# Patient Record
Sex: Female | Born: 1990 | Race: White | Hispanic: No | Marital: Married
Health system: Southern US, Community
[De-identification: ages and names within clinical notes are randomized; demographics above are authoritative.]

## PROBLEM LIST (undated history)

## (undated) DIAGNOSIS — G43909 Migraine, unspecified, not intractable, without status migrainosus: Secondary | ICD-10-CM

## (undated) HISTORY — PX: NO PAST SURGERIES: SHX2092

---

## 2013-05-25 ENCOUNTER — Telehealth: Payer: Self-pay | Admitting: Family Medicine

## 2013-05-25 ENCOUNTER — Ambulatory Visit: Payer: Self-pay | Admitting: Family Medicine

## 2013-05-25 DIAGNOSIS — Z0289 Encounter for other administrative examinations: Secondary | ICD-10-CM

## 2013-05-25 NOTE — Telephone Encounter (Signed)
Patient Information:  Caller Name: Megan Stone  Phone: [phone]  Patient: Megan Stone, Megan Stone  Gender: Female  DOB: 18-May-1990  Age: 23 Years  PCP: Adline Mangoampbell, [facility](Family Practice)  Pregnant: No  Office Follow Up:  Does the office need to follow up with this patient?: No  Instructions For The Office: N/A   Symptoms  Reason For Call & Symptoms: Pt calling regarding . Pt [facility] for sinus [facility]. Pt now has different sore throat. Pt states the back of her tongue is sore. Hurts to swallow, even into throat.  Reviewed Health History In EMR: Yes  Reviewed Medications In EMR: Yes  Reviewed Allergies In EMR: Yes  Reviewed Surgeries / Procedures: Yes  Date of Onset of Symptoms: 05/22/2013  Treatments Tried: Pt taking Ibuprofen Q 4-6 hrs prn for pain-  Treatments Tried Worked: Yes OB / GYN:  LMP: 05/18/2013  Guideline(s) Used:  Sore Throat  Disposition Per Guideline:   See Today in Office  Reason For Disposition Reached:   Severe sore throat pain  Advice Given:  N/A  Patient Will Follow Care Advice:  YES  Appointment Scheduled:  05/25/2013 14:00:00 Appointment Scheduled Provider:  Terrilee Filessmith, zach

## 2013-05-25 NOTE — Telephone Encounter (Signed)
Patient did not show.

## 2014-02-25 ENCOUNTER — Emergency Department (HOSPITAL_COMMUNITY)
Admission: EM | Admit: 2014-02-25 | Discharge: 2014-02-25 | Disposition: A | Discharge status: Nursing Home (Includes ICF) | Payer: BC Managed Care – PPO | Source: Home / Self Care | Attending: Emergency Medicine | Admitting: Emergency Medicine

## 2014-02-25 ENCOUNTER — Encounter (HOSPITAL_COMMUNITY): Payer: Self-pay | Admitting: Emergency Medicine

## 2014-02-25 ENCOUNTER — Emergency Department (HOSPITAL_COMMUNITY)
Admission: EM | Admit: 2014-02-25 | Discharge: 2014-02-26 | Disposition: A | Discharge status: Home / Self Care | Payer: BC Managed Care – PPO | Attending: Emergency Medicine | Admitting: Emergency Medicine

## 2014-02-25 DIAGNOSIS — Z3202 Encounter for pregnancy test, result negative: Secondary | ICD-10-CM | POA: Diagnosis not present

## 2014-02-25 DIAGNOSIS — R110 Nausea: Secondary | ICD-10-CM | POA: Insufficient documentation

## 2014-02-25 DIAGNOSIS — M549 Dorsalgia, unspecified: Secondary | ICD-10-CM | POA: Insufficient documentation

## 2014-02-25 DIAGNOSIS — R1013 Epigastric pain: Secondary | ICD-10-CM

## 2014-02-25 LAB — CBC WITH DIFFERENTIAL/PLATELET
BASOS ABS: 0 10*3/uL (ref 0.0–0.1)
Basophils Relative: 0 % (ref 0–1)
EOS ABS: 0.3 10*3/uL (ref 0.0–0.7)
Eosinophils Relative: 4 % (ref 0–5)
HEMATOCRIT: 39.4 % (ref 36.0–46.0)
Hemoglobin: 13.7 g/dL (ref 12.0–15.0)
LYMPHS ABS: 1.7 10*3/uL (ref 0.7–4.0)
Lymphocytes Relative: 22 % (ref 12–46)
MCH: 29.7 pg (ref 26.0–34.0)
MCHC: 34.8 g/dL (ref 30.0–36.0)
MCV: 85.5 fL (ref 78.0–100.0)
Monocytes Absolute: 0.6 10*3/uL (ref 0.1–1.0)
Monocytes Relative: 8 % (ref 3–12)
Neutro Abs: 5.2 10*3/uL (ref 1.7–7.7)
Neutrophils Relative %: 66 % (ref 43–77)
PLATELETS: 275 10*3/uL (ref 150–400)
RBC: 4.61 MIL/uL (ref 3.87–5.11)
RDW: 12.1 % (ref 11.5–15.5)
WBC: 7.8 10*3/uL (ref 4.0–10.5)

## 2014-02-25 LAB — COMPREHENSIVE METABOLIC PANEL
ALT: 15 U/L (ref 0–35)
AST: 18 U/L (ref 0–37)
Albumin: 4.2 g/dL (ref 3.5–5.2)
Alkaline Phosphatase: 57 U/L (ref 39–117)
Anion gap: 12 (ref 5–15)
BUN: 9 mg/dL (ref 6–23)
CO2: 25 mEq/L (ref 19–32)
CREATININE: 0.77 mg/dL (ref 0.50–1.10)
Calcium: 9.4 mg/dL (ref 8.4–10.5)
Chloride: 102 mEq/L (ref 96–112)
GFR calc Af Amer: >90 mL/min (ref >90)
GFR calc non Af Amer: >90 mL/min (ref >90)
GLUCOSE: 83 mg/dL (ref 70–99)
Potassium: 3.7 mEq/L (ref 3.7–5.3)
SODIUM: 139 meq/L (ref 137–147)
TOTAL PROTEIN: 7.1 g/dL (ref 6.0–8.3)
Total Bilirubin: 0.4 mg/dL (ref 0.3–1.2)

## 2014-02-25 LAB — POCT URINALYSIS DIP (DEVICE)
Bilirubin Urine: NEGATIVE
Glucose, UA: NEGATIVE mg/dL
HGB URINE DIPSTICK: NEGATIVE
KETONES UR: NEGATIVE mg/dL
Leukocytes, UA: NEGATIVE
Nitrite: NEGATIVE
Protein, ur: NEGATIVE mg/dL
SPECIFIC GRAVITY, URINE: 1.015 (ref 1.005–1.030)
UROBILINOGEN UA: 0.2 mg/dL (ref 0.0–1.0)
pH: 7.5 (ref 5.0–8.0)

## 2014-02-25 LAB — LIPASE, BLOOD: Lipase: 25 U/L (ref 11–59)

## 2014-02-25 LAB — I-STAT TROPONIN, ED: TROPONIN I, POC: 0.02 ng/mL (ref 0.00–0.08)

## 2014-02-25 LAB — POCT PREGNANCY, URINE: Preg Test, Ur: NEGATIVE

## 2014-02-25 NOTE — ED Provider Notes (Signed)
Medical screening examination/treatment/procedure(s) were performed by non-physician practitioner and as supervising physician I was immediately available for consultation/collaboration.  Leslee Homeavid Gillie Crisci, M.D.  Reuben Likesavid C Arrion Burruel, MD 02/25/14 2121

## 2014-02-25 NOTE — ED Notes (Addendum)
C/o epigastric pain onset this AM with nausea but no V or D.  Has not been able to eat due to pain and nausea.  Felt clammy off and on.  No chills or fever. C/o feeling lightheaded.  She ate yogurt and walnuts for breakfast. Pain radiates to her back and is sharp.

## 2014-02-25 NOTE — ED Provider Notes (Signed)
CSN: [id number]     Arrival date & time 02/25/14  1729 History   None    Chief Complaint  Patient presents with  . Abdominal Pain   (Consider location/radiation/quality/duration/timing/severity/associated sxs/prior Treatment) HPI Comments: Pt woke up with pain in epigastric area that is constant. Associated with feeling "clammy" and nausea. Never had similar sx previously.   Patient is a 23 y.o. female presenting with abdominal pain. The history is provided by the patient. No language interpreter was used.  Abdominal Pain Pain location:  Epigastric Pain quality: sharp   Pain radiates to:  L flank (radiates only when lying down or with deep breaths) Pain severity:  Severe Onset quality: woke up with pain  Duration:  1 day Timing:  Constant Progression:  Unchanged Chronicity:  New Relieved by:  Nothing Worsened by:  Nothing tried Ineffective treatments: alka seltzer, eating. Associated symptoms: chills and nausea   Associated symptoms: no constipation, no diarrhea, no dysuria, no fever, no vaginal discharge and no vomiting     History reviewed. No pertinent past medical history. History reviewed. No pertinent past surgical history. History reviewed. No pertinent family history. History  Substance Use Topics  . Smoking status: Never Smoker   . Smokeless tobacco: Not on file  . Alcohol Use: Yes     Comment: occasional   OB History   Grav Para Term Preterm Abortions TAB SAB Ect Mult Living                 Review of Systems  Constitutional: Positive for chills. Negative for fever.  Gastrointestinal: Positive for nausea and abdominal pain. Negative for vomiting, diarrhea, constipation and blood in stool.  Genitourinary: Negative for dysuria and vaginal discharge.    Allergies  Review of patient's allergies indicates no known allergies.  Home Medications   Prior to Admission medications   Not on File   LMP 01/28/2014 Physical Exam  Constitutional: She appears  well-developed and well-nourished. No distress.  Cardiovascular: Normal rate and regular rhythm.   Pulmonary/Chest: Effort normal and breath sounds normal.  Abdominal: Normal appearance and bowel sounds are normal. She exhibits no distension. There is no hepatosplenomegaly. There is tenderness in the epigastric area. There is no rigidity, no rebound, no guarding, no CVA tenderness, no tenderness at McBurney's point and negative Murphy's sign.    ED Course  Procedures (including critical care time) Labs Review Labs Reviewed  POCT PREGNANCY, URINE  POCT URINALYSIS DIP (DEVICE)    Imaging Review No results found.   MDM   1. Epigastric abdominal pain    Dr. Lorenz CoasterKeller examined pt with me.   Concern for diverticulitis. Transfer to ER for further eval.      Cathlyn ParsonsAngela M Kimari Coudriet, NP 02/25/14 [phone]

## 2014-02-25 NOTE — ED Notes (Signed)
Pt. reports epigastric pain radiating to mid back onset this morning with nausea and diaphoresis , pain worse with deep inspiration , denies chest pain / respirations unlabored . No cough or congestion . Denies fever or chills.

## 2014-02-26 LAB — URINALYSIS, ROUTINE W REFLEX MICROSCOPIC
BILIRUBIN URINE: NEGATIVE
GLUCOSE, UA: NEGATIVE mg/dL
Hgb urine dipstick: NEGATIVE
Ketones, ur: NEGATIVE mg/dL
Leukocytes, UA: NEGATIVE
Nitrite: NEGATIVE
PROTEIN: NEGATIVE mg/dL
Specific Gravity, Urine: 1.022 (ref 1.005–1.030)
UROBILINOGEN UA: 1 mg/dL (ref 0.0–1.0)
pH: 6.5 (ref 5.0–8.0)

## 2014-02-26 LAB — PREGNANCY, URINE: Preg Test, Ur: NEGATIVE

## 2014-02-26 MED ORDER — ONDANSETRON 4 MG PO TBDP
4.0000 mg | ORAL_TABLET | Freq: Once | ORAL | Status: AC
  Administered 2014-02-26: 4 mg via ORAL
  Filled 2014-02-26: qty 1

## 2014-02-26 MED ORDER — FAMOTIDINE 20 MG PO TABS
20.0000 mg | ORAL_TABLET | Freq: Once | ORAL | Status: AC
  Administered 2014-02-26: 20 mg via ORAL
  Filled 2014-02-26: qty 1

## 2014-02-26 MED ORDER — GI COCKTAIL ~~LOC~~
30.0000 mL | Freq: Once | ORAL | Status: AC
  Administered 2014-02-26: 30 mL via ORAL
  Filled 2014-02-26: qty 30

## 2014-02-26 MED ORDER — OMEPRAZOLE 20 MG PO CPDR: 20.0000 mg | DELAYED_RELEASE_CAPSULE | Freq: Every day | ORAL | Status: DC

## 2014-02-26 MED ORDER — ONDANSETRON 4 MG PO TBDP: 4.0000 mg | ORAL_TABLET | Freq: Three times a day (TID) | ORAL | Status: AC | PRN

## 2014-02-26 MED ORDER — FAMOTIDINE 20 MG PO TABS: 20.0000 mg | ORAL_TABLET | Freq: Two times a day (BID) | ORAL | Status: DC

## 2014-02-26 NOTE — ED Notes (Signed)
EDP with pt for assessment

## 2014-02-26 NOTE — ED Provider Notes (Signed)
CSN: [id number]     Arrival date & time 02/25/14  1919 History   First MD Initiated Contact with Patient 02/25/14 2359     Chief Complaint  Patient presents with  . Abdominal Pain     (Consider location/radiation/quality/duration/timing/severity/associated sxs/prior Treatment) HPI 23 year old female presents with epigastric abdominal pain that started this morning. She states she woke up with this pain. Describes as an aching pain. Throughout the day it is remain constant and she's not eaten all day until she was in the waiting room she was becoming so hungry. After eating approximate 5-10 minutes later she started having worse pain. Initially was a 7 at 10 and now is higher than that. Pain seems to radiate to her back just left of midline. Worsens with inspiration. Has had nausea all day but no vomiting. Denies any blood in her stool or black stools. No diarrhea or constipation. Has not been on any NSAIDs recently. No prior history of ulcers. Pain initially seemed to worsen with leaning over and putting pressure on abdomen.  History reviewed. No pertinent past medical history. History reviewed. No pertinent past surgical history. No family history on file. History  Substance Use Topics  . Smoking status: Never Smoker   . Smokeless tobacco: Not on file  . Alcohol Use: Yes     Comment: occasional   OB History   Grav Para Term Preterm Abortions TAB SAB Ect Mult Living                 Review of Systems  Constitutional: Negative for fever.  Respiratory: Negative for shortness of breath.   Cardiovascular: Negative for chest pain.  Gastrointestinal: Positive for nausea and abdominal pain. Negative for vomiting, diarrhea, constipation and blood in stool.  Genitourinary: Negative for dysuria, hematuria and menstrual problem.  Musculoskeletal: Positive for back pain.  All other systems reviewed and are negative.     Allergies  Review of patient's allergies indicates no known  allergies.  Home Medications   Prior to Admission medications   Not on File   BP 134/85  Pulse 74  Temp(Src) 98 F (36.7 C) (Oral)  Resp 18  SpO2 99%  LMP 02/03/2014 Physical Exam  Nursing note and vitals reviewed. Constitutional: She is oriented to person, place, and time. She appears well-developed and well-nourished.  HENT:  Head: Normocephalic and atraumatic.  Right Ear: External ear normal.  Left Ear: External ear normal.  Nose: Nose normal.  Eyes: Right eye exhibits no discharge. Left eye exhibits no discharge.  Cardiovascular: Normal rate, regular rhythm and normal heart sounds.   Pulmonary/Chest: Effort normal and breath sounds normal.  Abdominal: Soft. There is tenderness (mild) in the epigastric area. There is no rigidity, no rebound, no guarding, no CVA tenderness, no tenderness at McBurney's point and negative Murphy's sign.  Neurological: She is alert and oriented to person, place, and time.  Skin: Skin is warm and dry.    ED Course  Procedures (including critical care time) Labs Review Labs Reviewed  CBC WITH DIFFERENTIAL  COMPREHENSIVE METABOLIC PANEL  LIPASE, BLOOD  PREGNANCY, URINE  URINALYSIS, ROUTINE W REFLEX MICROSCOPIC  I-STAT TROPOININ, ED    Imaging Review No results found.   EKG Interpretation   Date/Time:  Friday February 25 2014 19:24:58 EDT Ventricular Rate:  75 PR Interval:  134 QRS Duration: 86 QT Interval:  368 QTC Calculation: 410 R Axis:   76 Text Interpretation:  Normal sinus rhythm Nonspecific T wave abnormality  Abnormal ECG No old  tracing to compare Confirmed by Ivelis Norgard  MD, Latarshia Jersey  ([id number]) on 02/26/2014 12:01:21 AM      MDM   Final diagnoses:  Epigastric abdominal pain    Mild epigastric pain/tenderness, but no RUQ, RLQ or other focal tenderness. My primary concern is for ulcer/gastritis. Low suspicion for cholecystitis, pancreatitis, colitis, SBO or appendicitis. Labwork here is normal. U/A and urine preg at urgent  care were negative. Patient's symptoms improved with GI cocktail and protonic's. This point she feels much better and is ready for discharge. Will DC with PPI, H2 blockers, and follow-up with PCP. Discussed strict return precautions.    Audree CamelScott T Aiyanah Kalama, MD 02/26/14 [phone]

## 2014-02-26 NOTE — Discharge Instructions (Signed)
Abdominal Pain °Many things can cause abdominal pain. Usually, abdominal pain is not caused by a disease and will improve without treatment. It can often be observed and treated at home. Your health care provider will do a physical exam and possibly order blood tests and X-rays to help determine the seriousness of your pain. However, in many cases, more time must pass before a clear cause of the pain can be found. Before that point, your health care provider may not know if you need more testing or further treatment. °HOME CARE INSTRUCTIONS  °Monitor your abdominal pain for any changes. The following actions may help to alleviate any discomfort you are experiencing: °· Only take over-the-counter or prescription medicines as directed by your health care provider. °· Do not take laxatives unless directed to do so by your health care provider. °· Try a clear liquid diet (broth, tea, or water) as directed by your health care provider. Slowly move to a bland diet as tolerated. °SEEK MEDICAL CARE IF: °· You have unexplained abdominal pain. °· You have abdominal pain associated with nausea or diarrhea. °· You have pain when you urinate or have a bowel movement. °· You experience abdominal pain that wakes you in the night. °· You have abdominal pain that is worsened or improved by eating food. °· You have abdominal pain that is worsened with eating fatty foods. °· You have a fever. °SEEK IMMEDIATE MEDICAL CARE IF:  °· Your pain does not go away within 2 hours. °· You keep throwing up (vomiting). °· Your pain is felt only in portions of the abdomen, such as the right side or the left lower portion of the abdomen. °· You pass bloody or black tarry stools. °MAKE SURE YOU: °· Understand these instructions.   °· Will watch your condition.   °· Will get help right away if you are not doing well or get worse.   °Document Released: 01/23/2005 Document Revised: 04/20/2013 Document Reviewed: 12/23/2012 °Exit[employer]® Patient Information  ©2015 [employer]. This information is not intended to replace advice given to you by your health care provider. Make sure you discuss any questions you have with your health care provider. °Peptic Ulcer °A peptic ulcer is a sore in the lining of your esophagus (esophageal ulcer), stomach (gastric ulcer), or in the first part of your small intestine (duodenal ulcer). The ulcer causes erosion into the deeper tissue. °CAUSES  °Normally, the lining of the stomach and the small intestine protects itself from the acid that digests food. The protective lining can be damaged by: °· An infection caused by a bacterium called Helicobacter pylori (H. pylori). °· Regular use of nonsteroidal anti-inflammatory drugs (NSAIDs), such as ibuprofen or aspirin. °· Smoking tobacco. °Other risk factors include being older than 50, drinking alcohol excessively, and having a family history of ulcer disease.  °SYMPTOMS  °· Burning pain or gnawing in the area between the chest and the belly button. °· Heartburn. °· Nausea and vomiting. °· Bloating. °The pain can be worse on an empty stomach and at night. If the ulcer results in bleeding, it can cause: °· Black, tarry stools. °· Vomiting of bright red blood. °· Vomiting of coffee-ground-looking materials. °DIAGNOSIS  °A diagnosis is usually made based upon your history and an exam. Other tests and procedures may be performed to find the cause of the ulcer. Finding a cause will help determine the best treatment. Tests and procedures may include: °· Blood tests, stool tests, or breath tests to check for the bacterium H.   pylori. °· An upper gastrointestinal (GI) series of the esophagus, stomach, and small intestine. °· An endoscopy to examine the esophagus, stomach, and small intestine. °· A biopsy. °TREATMENT  °Treatment may include: °· Eliminating the cause of the ulcer, such as smoking, NSAIDs, or alcohol. °· Medicines to reduce the amount of acid in your digestive tract. °· Antibiotic  medicines if the ulcer is caused by the H. pylori bacterium. °· An upper endoscopy to treat a bleeding ulcer. °· Surgery if the bleeding is severe or if the ulcer created a hole somewhere in the digestive system. °HOME CARE INSTRUCTIONS  °· Avoid tobacco, alcohol, and caffeine. Smoking can increase the acid in the stomach, and continued smoking will impair the healing of ulcers. °· Avoid foods and drinks that seem to cause discomfort or aggravate your ulcer. °· Only take medicines as directed by your caregiver. Do not substitute over-the-counter medicines for prescription medicines without talking to your caregiver. °· Keep any follow-up appointments and tests as directed. °SEEK MEDICAL CARE IF:  °· Your do not improve within 7 days of starting treatment. °· You have ongoing indigestion or heartburn. °SEEK IMMEDIATE MEDICAL CARE IF:  °· You have sudden, sharp, or persistent abdominal pain. °· You have bloody or dark black, tarry stools. °· You vomit blood or vomit that looks like coffee grounds. °· You become light-headed, weak, or feel faint. °· You become sweaty or clammy. °MAKE SURE YOU:  °· Understand these instructions. °· Will watch your condition. °· Will get help right away if you are not doing well or get worse. °Document Released: 04/12/2000 Document Revised: 08/30/2013 Document Reviewed: 11/13/2011 °ExitCare® Patient Information ©2015 [employer]. This information is not intended to replace advice given to you by your health care provider. Make sure you discuss any questions you have with your health care provider. ° °

## 2019-05-05 ENCOUNTER — Ambulatory Visit: Payer: Commercial Managed Care - PPO | Attending: Internal Medicine

## 2019-05-05 DIAGNOSIS — Z20822 Contact with and (suspected) exposure to covid-19: Secondary | ICD-10-CM | POA: Insufficient documentation

## 2019-05-07 ENCOUNTER — Telehealth: Payer: Self-pay | Admitting: General Practice

## 2019-05-07 LAB — NOVEL CORONAVIRUS, NAA: SARS-CoV-2, NAA: NOT DETECTED

## 2019-05-07 NOTE — Telephone Encounter (Signed)
Negative COVID results given. Patient results "NOT Detected." Caller expressed understanding. ° °

## 2019-10-28 ENCOUNTER — Other Ambulatory Visit: Payer: Self-pay

## 2019-10-28 ENCOUNTER — Emergency Department
Admission: EM | Admit: 2019-10-28 | Discharge: 2019-10-28 | Disposition: A | Discharge status: Home / Self Care | Payer: Commercial Managed Care - PPO | Attending: Emergency Medicine | Admitting: Emergency Medicine

## 2019-10-28 ENCOUNTER — Encounter: Payer: Self-pay | Admitting: Emergency Medicine

## 2019-10-28 ENCOUNTER — Emergency Department: Payer: Commercial Managed Care - PPO

## 2019-10-28 DIAGNOSIS — G43909 Migraine, unspecified, not intractable, without status migrainosus: Secondary | ICD-10-CM

## 2019-10-28 HISTORY — DX: Migraine, unspecified, not intractable, without status migrainosus: G43.909

## 2019-10-28 MED ORDER — METOCLOPRAMIDE HCL 5 MG/ML IJ SOLN
10.0000 mg | Freq: Once | INTRAMUSCULAR | Status: AC
  Administered 2019-10-28: 10 mg via INTRAVENOUS
  Filled 2019-10-28: qty 2

## 2019-10-28 MED ORDER — DIPHENHYDRAMINE HCL 50 MG/ML IJ SOLN
50.0000 mg | Freq: Once | INTRAMUSCULAR | Status: AC
  Administered 2019-10-28: 50 mg via INTRAVENOUS
  Filled 2019-10-28: qty 1

## 2019-10-28 MED ORDER — SODIUM CHLORIDE 0.9 % IV BOLUS
1000.0000 mL | Freq: Once | INTRAVENOUS | Status: AC
  Administered 2019-10-28: 1000 mL via INTRAVENOUS

## 2019-10-28 MED ORDER — KETOROLAC TROMETHAMINE 30 MG/ML IJ SOLN
30.0000 mg | Freq: Once | INTRAMUSCULAR | Status: AC
  Administered 2019-10-28: 30 mg via INTRAVENOUS
  Filled 2019-10-28: qty 1

## 2019-10-28 NOTE — ED Provider Notes (Signed)
[facility] Emergency Department Provider Note  Time seen: 1:07 PM  I have reviewed the triage vital signs and the nursing notes.   HISTORY  Chief Complaint Migraine   HPI Megan Stone is a 29 y.o. female with a past medical history of migraines presents to the emergency department with a migraine headache.  According to the patient she has pain in the back of her head that radiates around to the right side of her head.  States this is fairly typical for migraines also states photo and phonophobia.  States nausea is up with several episodes of vomiting which is also typical.  Patient took her home migraine medications without effect so she came to the emergency department for evaluation.  Patient states she has had increasing migraines since the birth of her child 2 years ago.  Patient states this is worse when she has had in the last several years.  Has been ongoing for the past 2 days without relief.  Patient states she has had to come to the emergency department previously for headaches but has been several years.  No numbness or weakness of any arm or leg confusion or slurred speech.  No fever.   Past Medical History:  Diagnosis Date  . Migraines     There are no problems to display for this patient.   History reviewed. No pertinent surgical history.  Prior to Admission medications   Medication Sig Start Date End Date Taking? Authorizing Provider  famotidine (PEPCID) 20 MG tablet Take 1 tablet (20 mg total) by mouth 2 (two) times daily. 02/26/14   Pricilla Loveless, MD  omeprazole (PRILOSEC) 20 MG capsule Take 1 capsule (20 mg total) by mouth daily. 02/26/14   Pricilla Loveless, MD  ondansetron (ZOFRAN ODT) 4 MG disintegrating tablet Take 1 tablet (4 mg total) by mouth every 8 (eight) hours as needed for nausea or vomiting. 02/26/14   Pricilla Loveless, MD    No Known Allergies  No family history on file.  Social History Social History   Tobacco Use  .  Smoking status: Never Smoker  Substance Use Topics  . Alcohol use: Yes    Comment: occasional  . Drug use: No    Review of Systems Constitutional: Negative for fever. Cardiovascular: Negative for chest pain. Respiratory: Negative for shortness of breath. Gastrointestinal: Negative for abdominal pain Musculoskeletal: Negative for musculoskeletal complaints Neurological: Moderate dull headache in the back of her head radiating to the right side. All other ROS negative  ____________________________________________   PHYSICAL EXAM:  VITAL SIGNS: ED Triage Vitals  Enc Vitals Group     BP 10/28/19 1119 (!) 137/99     Pulse Rate 10/28/19 1119 77     Resp 10/28/19 1119 16     Temp 10/28/19 1119 98.4 F (36.9 C)     Temp Source 10/28/19 1119 Oral     SpO2 10/28/19 1119 99 %     Weight 10/28/19 1114 257 lb (116.6 kg)     Height 10/28/19 1114 5\' 6"  (1.676 m)     Head Circumference --      Peak Flow --      Pain Score 10/28/19 1114 7     Pain Loc --      Pain Edu? --      Excl. in GC? --    Constitutional: Alert and oriented. Well appearing and in no distress. Eyes: Positive for photophobia. ENT      Head: Normocephalic and atraumatic.  Mouth/Throat: Mucous membranes are moist. Cardiovascular: Normal rate, regular rhythm Respiratory: Normal respiratory effort without tachypnea nor retractions. Breath sounds are clear Gastrointestinal: Soft and nontender. No distention. Musculoskeletal: Nontender with normal range of motion in all extremities.  Neurologic:  Normal speech and language. No gross focal neurologic deficits.  Equal grip strength bilaterally.  No pronator drift.  Cranial nerves intact. Skin:  Skin is warm, dry and intact.  Psychiatric: Mood and affect are normal.  ____________________________________________   RADIOLOGY  CT is negative  ____________________________________________   INITIAL IMPRESSION / ASSESSMENT AND PLAN / ED COURSE  Pertinent labs  & imaging results that were available during my care of the patient were reviewed by me and considered in my medical decision making (see chart for details).   Patient presents to the emergency department for migraine.  Patient states feels like her typical migraines which have been increasing in intensity and frequency since her child was delivered 2 years ago.  Patient states she sees her PCP took her typical migraine medications without relief.  States they are not talk about being referred to a headache specialist.  Patient denies any history of brain imaging in the past CT or MRI.  We will proceed with CT imaging as a precaution.  We will dose Toradol Reglan Benadryl and IV fluids and reassess.  Patient agreeable to plan.  CT scan is negative.  Patient states the headache is much improved.  We will discharge home.  Provided my typical migraine return precautions.  [name] was evaluated in Emergency Department on 10/28/2019 for the symptoms described in the history of present illness. She was evaluated in the context of the global COVID-19 pandemic, which necessitated consideration that the patient might be at risk for infection with the SARS-CoV-2 virus that causes COVID-19. Institutional protocols and algorithms that pertain to the evaluation of patients at risk for COVID-19 are in a state of rapid change based on information released by regulatory bodies including the CDC and federal and state organizations. These policies and algorithms were followed during the patient's care in the ED.  ____________________________________________   FINAL CLINICAL IMPRESSION(S) / ED DIAGNOSES  Migraine headache   Minna Antis, MD 10/28/19 1449

## 2019-10-28 NOTE — ED Triage Notes (Signed)
Pt reports migraine for the last 48 hours. Pt states hx of the same since she was a kid. Pt reports take maxol for it but no relief. States migraine is the same as always.

## 2021-05-03 IMAGING — CT CT HEAD W/O CM
3 series · 16 of 47 positions shown, 19 images · non-contrast
Comparison: None.

CLINICAL DATA: Headache for 48 hours.  No known injury.

EXAM:
CT HEAD WITHOUT CONTRAST
TECHNIQUE: Contiguous axial images were obtained from the base of the skull
through the vertex without intravenous contrast.

[Series 2: head wo · axial · 0.47mm/px · z∈[-131,-6]mm · 10 of 30 slices shown, 13 images]
[im 3/30  brain]
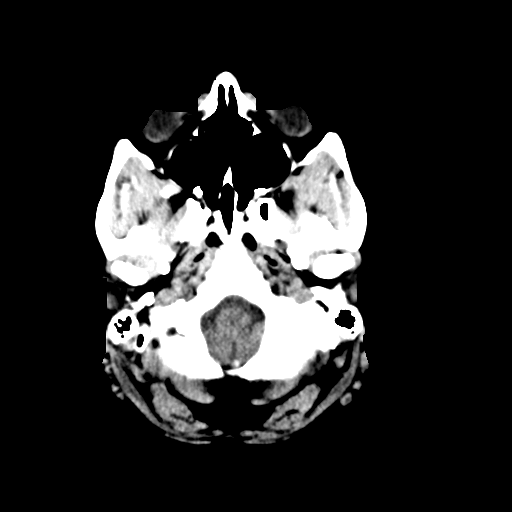
[im 3/30  bone]
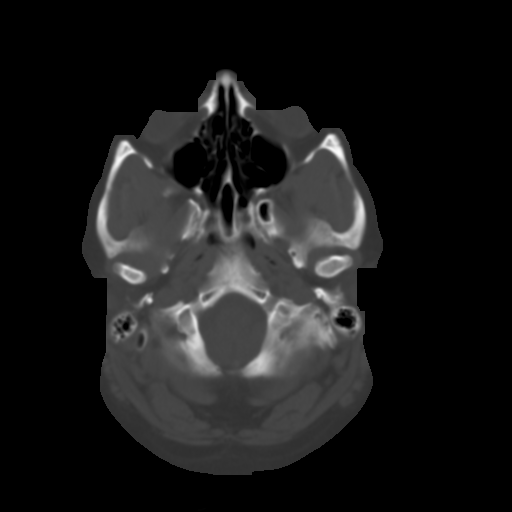
[im 6/30  brain]
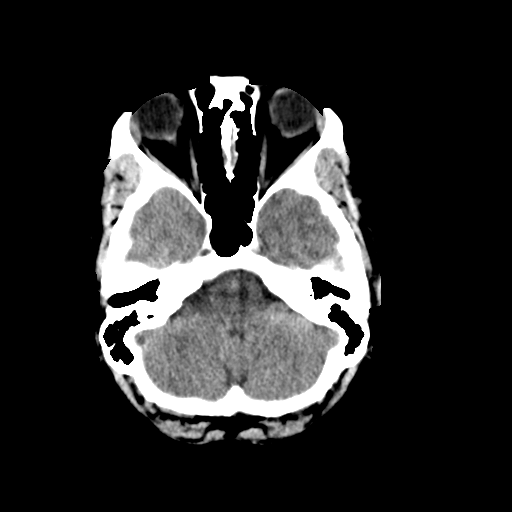
[im 9/30  brain]
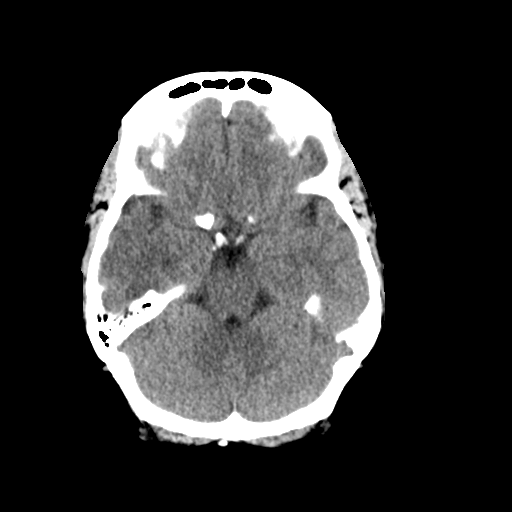
[im 11/30  brain]
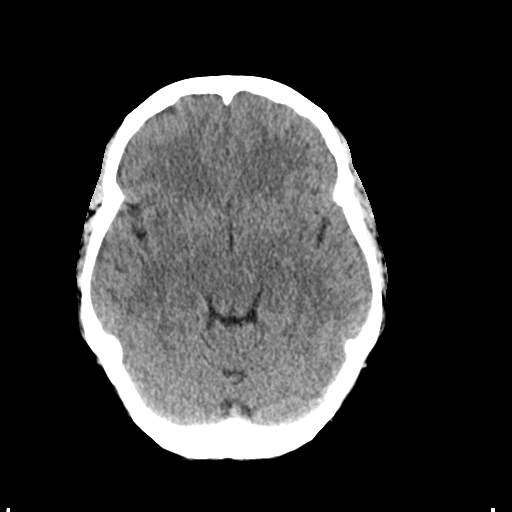
[im 14/30  brain]
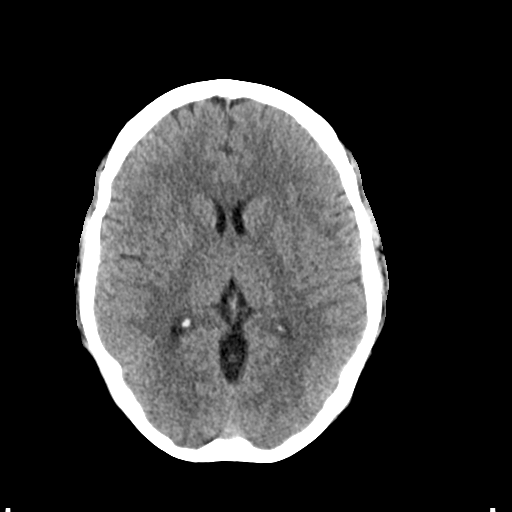
[im 14/30  bone]
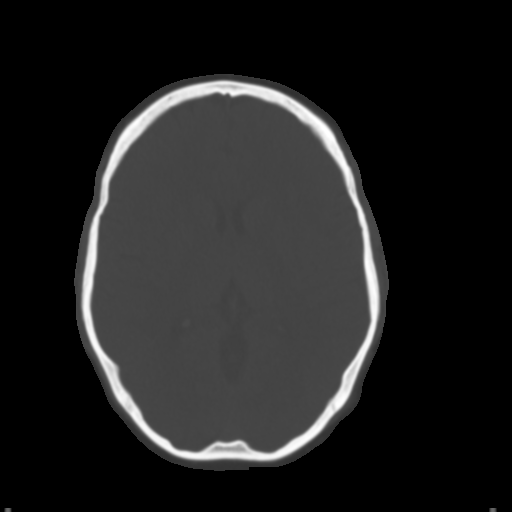
[im 17/30  brain]
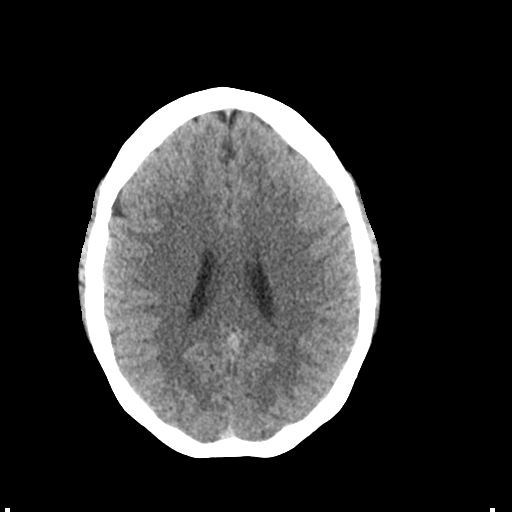
[im 20/30  brain]
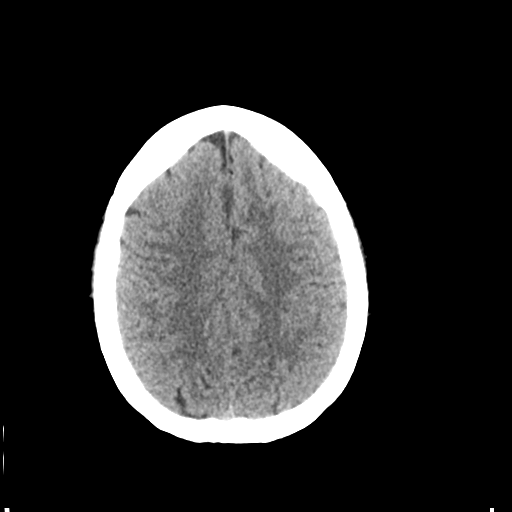
[im 23/30  brain]
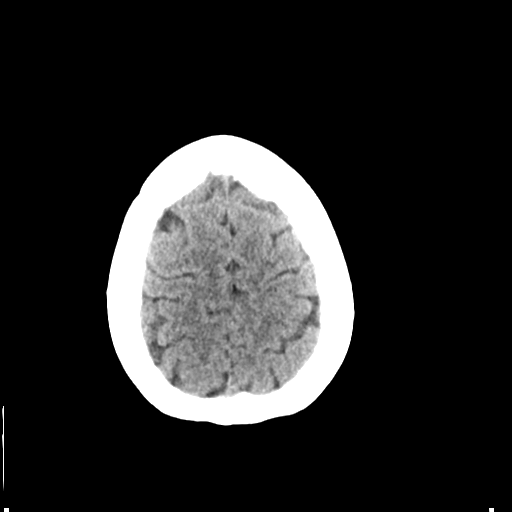
[im 25/30  brain]
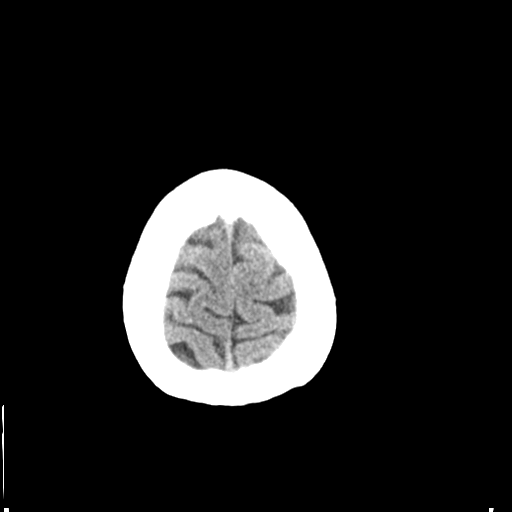
[im 25/30  bone]
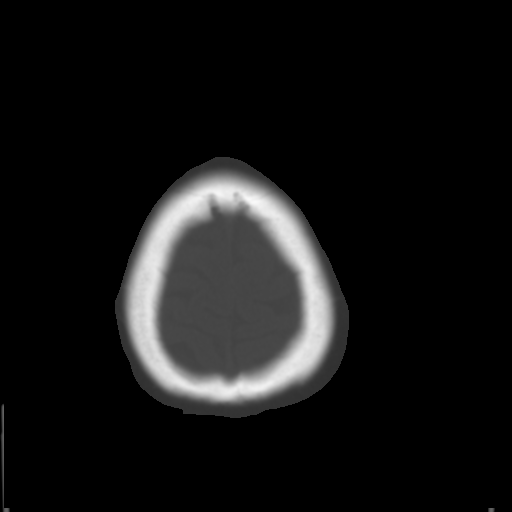
[im 28/30  brain]
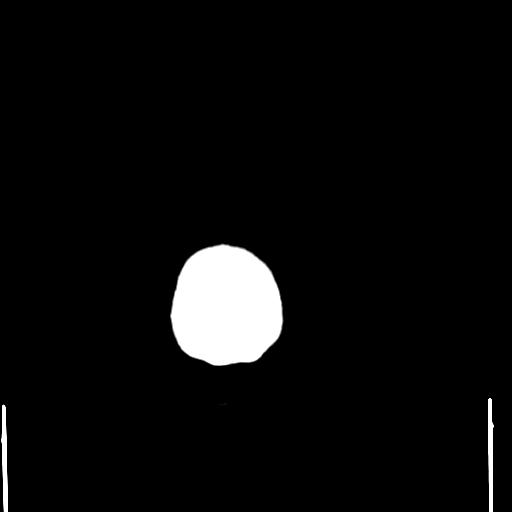

[Series 4: coronal soft tissue · coronal · 0.31mm/px · 3 of 68 slices shown]
[im 23/68  brain]
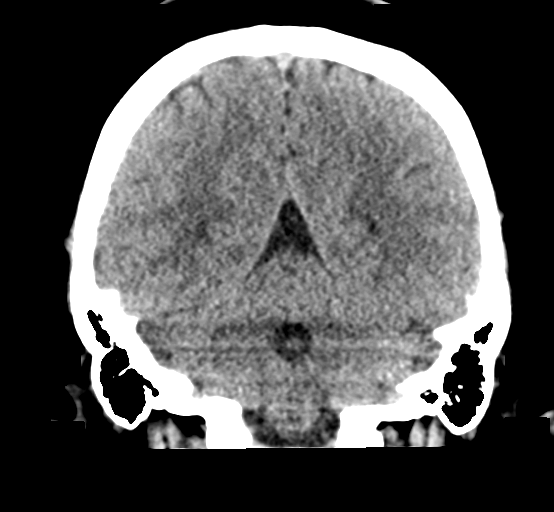
[im 30/68  brain]
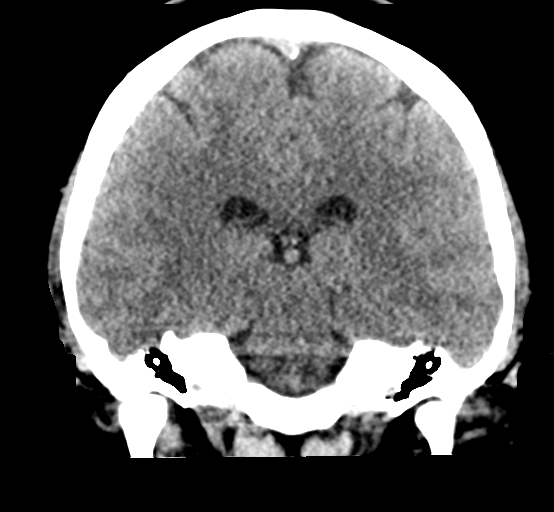
[im 38/68  brain]
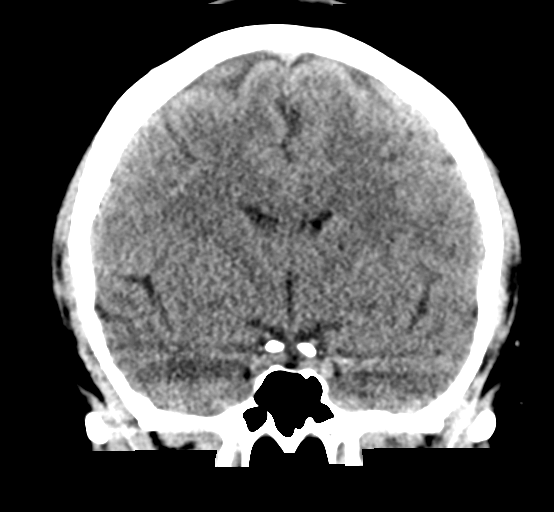

[Series 5: sagittal soft tissue · sagittal · 0.31mm/px · 3 of 58 slices shown]
[im 20/58  brain]
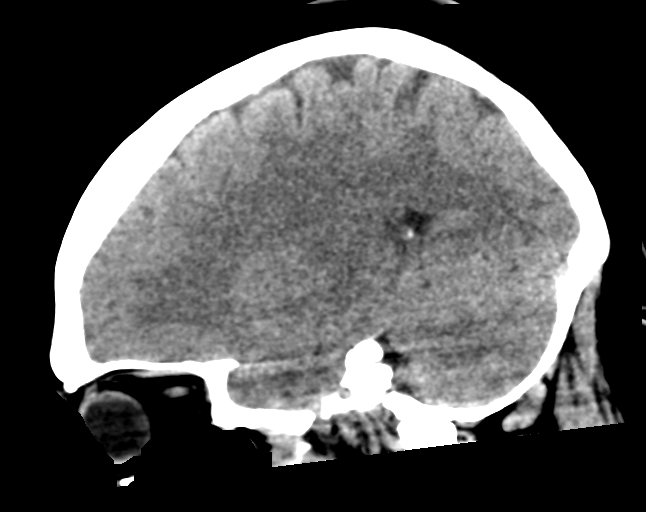
[im 29/58  brain]
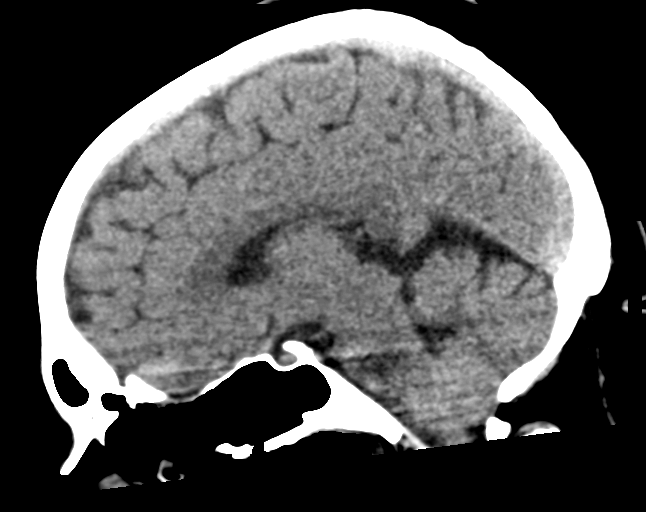
[im 39/58  brain]
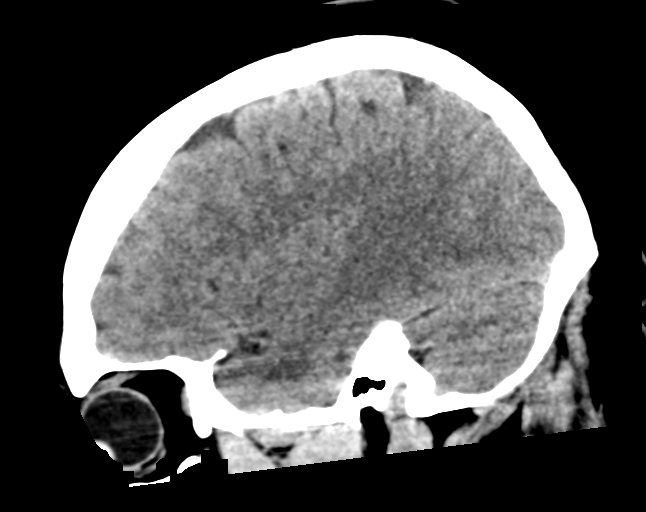

[16 of 47 positions shown; findings below may reference images not displayed]

FINDINGS: Brain: No evidence of acute infarction, hemorrhage, hydrocephalus,
extra-axial collection or mass lesion/mass effect.

Vascular: No hyperdense vessel or unexpected calcification.

Skull: Normal. Negative for fracture or focal lesion.

Sinuses/Orbits: Negative.

Other: None.
IMPRESSION: Normal head CT.

## 2022-12-10 ENCOUNTER — Other Ambulatory Visit (HOSPITAL_COMMUNITY): Payer: Self-pay

## 2022-12-10 MED ORDER — ZEPBOUND 15 MG/0.5ML ~~LOC~~ SOAJ
15.0000 mg | SUBCUTANEOUS | 0 refills | Status: DC
  Filled 2022-12-10 (×2): qty 2, 28d supply, fill #0

## 2022-12-13 ENCOUNTER — Other Ambulatory Visit (HOSPITAL_COMMUNITY): Payer: Self-pay

## 2023-02-04 ENCOUNTER — Other Ambulatory Visit (HOSPITAL_COMMUNITY): Payer: Self-pay

## 2023-02-04 MED ORDER — ZEPBOUND 15 MG/0.5ML ~~LOC~~ SOAJ
15.0000 mg | SUBCUTANEOUS | 0 refills | Status: DC
  Filled 2023-02-04: qty 2, 28d supply, fill #0

## 2023-02-12 ENCOUNTER — Other Ambulatory Visit (HOSPITAL_COMMUNITY): Payer: Self-pay

## 2023-02-12 ENCOUNTER — Other Ambulatory Visit: Payer: Self-pay

## 2023-05-02 ENCOUNTER — Other Ambulatory Visit (HOSPITAL_COMMUNITY): Payer: Self-pay

## 2023-05-02 MED ORDER — ZEPBOUND 15 MG/0.5ML ~~LOC~~ SOAJ
15.0000 mg | SUBCUTANEOUS | 0 refills | Status: DC
  Filled 2023-05-02: qty 2, 28d supply, fill #0

## 2023-05-09 ENCOUNTER — Other Ambulatory Visit (HOSPITAL_COMMUNITY): Payer: Self-pay

## 2023-07-22 ENCOUNTER — Other Ambulatory Visit (HOSPITAL_COMMUNITY): Payer: Self-pay

## 2023-07-22 MED ORDER — ZEPBOUND 15 MG/0.5ML ~~LOC~~ SOAJ
15.0000 mg | SUBCUTANEOUS | 0 refills | Status: DC
  Filled 2023-07-22: qty 2, 28d supply, fill #0

## 2023-07-25 ENCOUNTER — Other Ambulatory Visit (HOSPITAL_COMMUNITY): Payer: Self-pay

## 2023-09-04 ENCOUNTER — Other Ambulatory Visit (HOSPITAL_COMMUNITY): Payer: Self-pay

## 2023-09-04 MED ORDER — ZEPBOUND 15 MG/0.5ML ~~LOC~~ SOAJ
15.0000 mg | SUBCUTANEOUS | 0 refills | Status: DC
  Filled 2023-09-04: qty 2, 28d supply, fill #0

## 2023-09-05 ENCOUNTER — Other Ambulatory Visit (HOSPITAL_COMMUNITY): Payer: Self-pay

## 2023-10-03 ENCOUNTER — Other Ambulatory Visit (HOSPITAL_COMMUNITY): Payer: Self-pay

## 2023-10-03 ENCOUNTER — Encounter: Payer: Self-pay | Admitting: Neurology

## 2023-10-03 ENCOUNTER — Telehealth: Payer: Self-pay

## 2023-10-03 ENCOUNTER — Ambulatory Visit (INDEPENDENT_AMBULATORY_CARE_PROVIDER_SITE_OTHER): Admitting: Neurology

## 2023-10-03 VITALS — BP 125/89 | HR 101 | Ht 66.0 in | Wt 200.4 lb

## 2023-10-03 DIAGNOSIS — H539 Unspecified visual disturbance: Secondary | ICD-10-CM

## 2023-10-03 DIAGNOSIS — G43E09 Chronic migraine with aura, not intractable, without status migrainosus: Secondary | ICD-10-CM | POA: Diagnosis not present

## 2023-10-03 DIAGNOSIS — R519 Headache, unspecified: Secondary | ICD-10-CM

## 2023-10-03 DIAGNOSIS — R442 Other hallucinations: Secondary | ICD-10-CM | POA: Diagnosis not present

## 2023-10-03 DIAGNOSIS — R51 Headache with orthostatic component, not elsewhere classified: Secondary | ICD-10-CM

## 2023-10-03 DIAGNOSIS — G43709 Chronic migraine without aura, not intractable, without status migrainosus: Secondary | ICD-10-CM | POA: Diagnosis not present

## 2023-10-03 MED ORDER — EMGALITY 120 MG/ML ~~LOC~~ SOAJ: 240.0000 mg | Freq: Once | SUBCUTANEOUS | 0 refills | Status: DC

## 2023-10-03 MED ORDER — ZOLMITRIPTAN 5 MG PO TABS: 5.0000 mg | ORAL_TABLET | ORAL | 11 refills | Status: DC | PRN

## 2023-10-03 MED ORDER — NURTEC 75 MG PO TBDP: 75.0000 mg | ORAL_TABLET | Freq: Every day | ORAL | Status: DC | PRN

## 2023-10-03 MED ORDER — ALPRAZOLAM 0.25 MG PO TABS: ORAL_TABLET | ORAL | 0 refills | Status: DC

## 2023-10-03 NOTE — Patient Instructions (Addendum)
 Prevention: Emgality (Other shot is Ajovy) Acute: Zomig Catch early. If you know you will have the migraine during a time period like menses you can pretreat.  Nurtec : daily as needed for migraine MRI of the brain - [place] , xanax   Discussed:  There is increased risk for stroke in women with migraine with aura and a contraindication for the combined contraceptive pill for use by women who have migraine with aura. The risk for women with migraine without aura is lower. However other risk factors like smoking are far more likely to increase stroke risk than migraine. There is a recommendation for no smoking and for the use of OCPs without estrogen such as progestogen only pills particularly for women with migraine with aura.Megan Stone People who have migraine headaches with auras may be 3 times more likely to have a stroke caused by a blood clot, compared to migraine patients who don't see auras. Women who take hormone-replacement therapy may be 30 percent more likely to suffer a clot-based stroke than women not taking medication containing estrogen. Other risk factors like smoking and high blood pressure may be  much more important. And stroke is still a rare complication due to migraine aura and is controversial and lower doses may not cause a risk.  Rimegepant Disintegrating Tablets What is this medication? RIMEGEPANT (ri ME je pant) prevents and treats migraines. It works by blocking a substance in the body that causes migraines. This medicine may be used for other purposes; ask your health care provider or pharmacist if you have questions. COMMON BRAND NAME(S): NURTEC ODT What should I tell my care team before I take this medication? They need to know if you have any of these conditions: Kidney disease Liver disease An unusual or allergic reaction to rimegepant, other medications, foods, dyes, or preservatives Pregnant or trying to get pregnant Breast-feeding How should I use this  medication? Take this medication by mouth. Take it as directed on the prescription label. Leave the tablet in the sealed pack until you are ready to take it. With dry hands, open the pack and gently remove the tablet. If the tablet breaks or crumbles, throw it away. Use a new tablet. Place the tablet in the mouth and allow it to dissolve. Then, swallow it. Do not cut, crush, or chew this medication. You do not need water to take this medication. Talk to your care team about the use of this medication in children. Special care may be needed. Overdosage: If you think you have taken too much of this medicine contact a poison control center or emergency room at once. NOTE: This medicine is only for you. Do not share this medicine with others. What if I miss a dose? This does not apply. This medication is not for regular use. What may interact with this medication? Certain medications for fungal infections, such as fluconazole, itraconazole Rifampin This list may not describe all possible interactions. Give your health care provider a list of all the medicines, herbs, non-prescription drugs, or dietary supplements you use. Also tell them if you smoke, drink alcohol, or use illegal drugs. Some items may interact with your medicine. What should I watch for while using this medication? Visit your care team for regular checks on your progress. Tell your care team if your symptoms do not start to get better or if they get worse. What side effects may I notice from receiving this medication? Side effects that you should report to your care team as soon  as possible: Allergic reactions--skin rash, itching, hives, swelling of the face, lips, tongue, or throat Side effects that usually do not require medical attention (report to your care team if they continue or are bothersome): Nausea Stomach pain This list may not describe all possible side effects. Call your doctor for medical advice about side effects. You  may report side effects to FDA at 1-800-FDA-1088. Where should I keep my medication? Keep out of the reach of children and pets. Store at room temperature between 20 and 25 degrees C (68 and 77 degrees F). Get rid of any unused medication after the expiration date. To get rid of medications that are no longer needed or have expired: Take the medication to a medication take-back program. Check with your pharmacy or law enforcement to find a location. If you cannot return the medication, check the label or package insert to see if the medication should be thrown out in the garbage or flushed down the toilet. If you are not sure, ask your care team. If it is safe to put it in the trash, take the medication out of the container. Mix the medication with cat litter, dirt, coffee grounds, or other unwanted substance. Seal the mixture in a bag or container. Put it in the trash. NOTE: This sheet is a summary. It may not cover all possible information. If you have questions about this medicine, talk to your doctor, pharmacist, or health care provider.  2024 Elsevier/Gold Standard (2021-06-06 00:00:00) Zolmitriptan Tablets What is this medication? ZOLMITRIPTAN (zohl mi TRIP tan) treats migraines. It works by blocking pain signals and narrowing blood vessels in the brain. It belongs to a group of medications called triptans. It is not used to prevent migraines. This medicine may be used for other purposes; ask your health care provider or pharmacist if you have questions. COMMON BRAND NAME(S): Zomig What should I tell my care team before I take this medication? They need to know if you have any of these conditions: Circulation problems in fingers and toes Diabetes (high blood sugar) Heart disease High blood pressure High cholesterol History of irregular heartbeat History of stroke Kidney disease Liver disease Stomach or intestine problems Tobacco use An unusual or allergic reaction to zolmitriptan,  other medications, foods, dyes, or preservatives Pregnant or trying to get pregnant Breast-feeding How should I use this medication? Take this medication by mouth with a glass of water. Follow the directions on the prescription label. Do not take it more often than directed. Talk to your care team about the use of this medication in children. Special care may be needed. Overdosage: If you think you have taken too much of this medicine contact a poison control center or emergency room at once. NOTE: This medicine is only for you. Do not share this medicine with others. What if I miss a dose? This does not apply. This medication is not for regular use. What may interact with this medication? Do not take this medication with any of the following: Certain medications for migraine headache, such as almotriptan, eletriptan, frovatriptan, naratriptan, rizatriptan, sumatriptan, zolmitriptan Ergot alkaloids, such as dihydroergotamine, ergonovine, ergotamine, methylergonovine MAOIs, such as Carbex, Eldepryl, Marplan, Nardil, and Parnate This medication may also interact with the following: Certain medications for mental health conditions Cimetidine This list may not describe all possible interactions. Give your health care provider a list of all the medicines, herbs, non-prescription drugs, or dietary supplements you use. Also tell them if you smoke, drink alcohol, or use illegal drugs.  Some items may interact with your medicine. What should I watch for while using this medication? Visit your care team for regular checks on your progress. Tell your care team if your symptoms do not start to get better or if they get worse. This medication may affect your coordination, reaction time, or judgment. Do not drive or operate machinery until you know how this medication affects you. Sit up or stand slowly to reduce the risk of dizzy or fainting spells. Drinking alcohol with this medication can increase the risk  of these side effects. Your mouth may get dry. Chewing sugarless gum or sucking hard candy and drinking plenty of water may help. Contact your care team if the problem does not go away or is severe. Tell your care team right away if you have any change in your eyesight. If you take migraine medications for 10 or more days a month, your migraines may get worse. Keep a diary of headache days and medication use. Contact your care team if your migraine attacks occur more frequently. What side effects may I notice from receiving this medication? Side effects that you should report to your care team as soon as possible: Allergic reactions--skin rash, itching, hives, swelling of the face, lips, tongue, or throat Burning, pain, tingling, or color changes in the legs or feet Heart attack--pain or tightness in the chest, shoulders, arms, or jaw, nausea, shortness of breath, cold or clammy skin, feeling faint or lightheaded Heart rhythm changes--fast or irregular heartbeat, dizziness, feeling faint or lightheaded, chest pain, trouble breathing Increase in blood pressure Irritability, confusion, fast or irregular heartbeat, muscle stiffness, twitching muscles, sweating, high fever, seizure, chills, vomiting, diarrhea, which may be signs of serotonin syndrome Raynaud's--cool, numb, or painful fingers or toes that may change color from pale, to blue, to red Seizures Stroke--sudden numbness or weakness of the face, arm, or leg, trouble speaking, confusion, trouble walking, loss of balance or coordination, dizziness, severe headache, change in vision Sudden or severe stomach pain, nausea, vomiting, fever, or bloody diarrhea Vision loss Side effects that usually do not require medical attention (report to your care team if they continue or are bothersome): Dizziness General discomfort or fatigue This list may not describe all possible side effects. Call your doctor for medical advice about side effects. You may  report side effects to FDA at 1-800-FDA-1088. Where should I keep my medication? Keep out of the reach of children and pets. Store at room temperature between 20 and 25 degrees C (68 and 77 degrees F). Protect from light and moisture. Throw away any unused medication after the expiration date. NOTE: This sheet is a summary. It may not cover all possible information. If you have questions about this medicine, talk to your doctor, pharmacist, or health care provider.  2024 Elsevier/Gold Standard (2020-12-26 00:00:00) Galcanezumab Injection What is this medication? GALCANEZUMAB (gal ka NEZ ue mab) prevents migraines. It works by blocking a substance in the body that causes migraines. It may also be used to treat cluster headaches. It is a monoclonal antibody. This medicine may be used for other purposes; ask your health care provider or pharmacist if you have questions. COMMON BRAND NAME(S): Emgality What should I tell my care team before I take this medication? They need to know if you have any of these conditions: An unusual or allergic reaction to galcanezumab, other medications, foods, dyes, or preservatives Pregnant or trying to get pregnant Breast-feeding How should I use this medication? This medication is injected under  the skin. You will be taught how to prepare and give it. Take it as directed on the prescription label. Keep taking it unless your care team tells you to stop. It is important that you put your used needles and syringes in a special sharps container. Do not put them in a trash can. If you do not have a sharps container, call your pharmacist or care team to get one. Talk to your care team about the use of this medication in children. Special care may be needed. Overdosage: If you think you have taken too much of this medicine contact a poison control center or emergency room at once. NOTE: This medicine is only for you. Do not share this medicine with others. What if I miss  a dose? If you miss a dose, take it as soon as you can. If it is almost time for your next dose, take only that dose. Do not take double or extra doses. What may interact with this medication? Interactions are not expected. This list may not describe all possible interactions. Give your health care provider a list of all the medicines, herbs, non-prescription drugs, or dietary supplements you use. Also tell them if you smoke, drink alcohol, or use illegal drugs. Some items may interact with your medicine. What should I watch for while using this medication? Visit your care team for regular checks on your progress. Tell your care team if your symptoms do not start to get better or if they get worse. What side effects may I notice from receiving this medication? Side effects that you should report to your care team as soon as possible: Allergic reactions or angioedema--skin rash, itching or hives, swelling of the face, eyes, lips, tongue, arms, or legs, trouble swallowing or breathing Side effects that usually do not require medical attention (report to your care team if they continue or are bothersome): Pain, redness, or irritation at injection site This list may not describe all possible side effects. Call your doctor for medical advice about side effects. You may report side effects to FDA at 1-800-FDA-1088. Where should I keep my medication? Keep out of the reach of children and pets. Store in a refrigerator or at room temperature between 20 and 25 degrees C (68 and 77 degrees F). Refrigeration (preferred): Store in the refrigerator. Do not freeze. Keep in the original container until you are ready to take it. Remove the dose from the carton about 30 minutes before it is time for you to use it. If the dose is not used, it may be stored in original container at room temperature for 7 days. Get rid of any unused medication after the expiration date. Room Temperature: This medication may be stored at  room temperature for up to 7 days. Keep it in the original container. Protect from light until time of use. If it is stored at room temperature, get rid of any unused medication after 7 days or after it expires, whichever is first. To get rid of medications that are no longer needed or have expired: Take the medication to a medication take-back program. Check with your pharmacy or law enforcement to find a location. If you cannot return the medication, ask your pharmacist or care team how to get rid of this medication safely. NOTE: This sheet is a summary. It may not cover all possible information. If you have questions about this medicine, talk to your doctor, pharmacist, or health care provider.  2024 Elsevier/Gold Standard (2021-06-11 00:00:00) Zolmitriptan  Tablets What is this medication? ZOLMITRIPTAN (zohl mi TRIP tan) treats migraines. It works by blocking pain signals and narrowing blood vessels in the brain. It belongs to a group of medications called triptans. It is not used to prevent migraines. This medicine may be used for other purposes; ask your health care provider or pharmacist if you have questions. COMMON BRAND NAME(S): Zomig What should I tell my care team before I take this medication? They need to know if you have any of these conditions: Circulation problems in fingers and toes Diabetes (high blood sugar) Heart disease High blood pressure High cholesterol History of irregular heartbeat History of stroke Kidney disease Liver disease Stomach or intestine problems Tobacco use An unusual or allergic reaction to zolmitriptan, other medications, foods, dyes, or preservatives Pregnant or trying to get pregnant Breast-feeding How should I use this medication? Take this medication by mouth with a glass of water. Follow the directions on the prescription label. Do not take it more often than directed. Talk to your care team about the use of this medication in children.  Special care may be needed. Overdosage: If you think you have taken too much of this medicine contact a poison control center or emergency room at once. NOTE: This medicine is only for you. Do not share this medicine with others. What if I miss a dose? This does not apply. This medication is not for regular use. What may interact with this medication? Do not take this medication with any of the following: Certain medications for migraine headache, such as almotriptan, eletriptan, frovatriptan, naratriptan, rizatriptan, sumatriptan, zolmitriptan Ergot alkaloids, such as dihydroergotamine, ergonovine, ergotamine, methylergonovine MAOIs, such as Carbex, Eldepryl, Marplan, Nardil, and Parnate This medication may also interact with the following: Certain medications for mental health conditions Cimetidine This list may not describe all possible interactions. Give your health care provider a list of all the medicines, herbs, non-prescription drugs, or dietary supplements you use. Also tell them if you smoke, drink alcohol, or use illegal drugs. Some items may interact with your medicine. What should I watch for while using this medication? Visit your care team for regular checks on your progress. Tell your care team if your symptoms do not start to get better or if they get worse. This medication may affect your coordination, reaction time, or judgment. Do not drive or operate machinery until you know how this medication affects you. Sit up or stand slowly to reduce the risk of dizzy or fainting spells. Drinking alcohol with this medication can increase the risk of these side effects. Your mouth may get dry. Chewing sugarless gum or sucking hard candy and drinking plenty of water may help. Contact your care team if the problem does not go away or is severe. Tell your care team right away if you have any change in your eyesight. If you take migraine medications for 10 or more days a month, your migraines  may get worse. Keep a diary of headache days and medication use. Contact your care team if your migraine attacks occur more frequently. What side effects may I notice from receiving this medication? Side effects that you should report to your care team as soon as possible: Allergic reactions--skin rash, itching, hives, swelling of the face, lips, tongue, or throat Burning, pain, tingling, or color changes in the legs or feet Heart attack--pain or tightness in the chest, shoulders, arms, or jaw, nausea, shortness of breath, cold or clammy skin, feeling faint or lightheaded Heart rhythm changes--fast or  irregular heartbeat, dizziness, feeling faint or lightheaded, chest pain, trouble breathing Increase in blood pressure Irritability, confusion, fast or irregular heartbeat, muscle stiffness, twitching muscles, sweating, high fever, seizure, chills, vomiting, diarrhea, which may be signs of serotonin syndrome Raynaud's--cool, numb, or painful fingers or toes that may change color from pale, to blue, to red Seizures Stroke--sudden numbness or weakness of the face, arm, or leg, trouble speaking, confusion, trouble walking, loss of balance or coordination, dizziness, severe headache, change in vision Sudden or severe stomach pain, nausea, vomiting, fever, or bloody diarrhea Vision loss Side effects that usually do not require medical attention (report to your care team if they continue or are bothersome): Dizziness General discomfort or fatigue This list may not describe all possible side effects. Call your doctor for medical advice about side effects. You may report side effects to FDA at 1-800-FDA-1088. Where should I keep my medication? Keep out of the reach of children and pets. Store at room temperature between 20 and 25 degrees C (68 and 77 degrees F). Protect from light and moisture. Throw away any unused medication after the expiration date. NOTE: This sheet is a summary. It may not cover all  possible information. If you have questions about this medicine, talk to your doctor, pharmacist, or health care provider.  2024 Elsevier/Gold Standard (2020-12-26 00:00:00)Galcanezumab Injection What is this medication? GALCANEZUMAB (gal ka NEZ ue mab) prevents migraines. It works by blocking a substance in the body that causes migraines. It may also be used to treat cluster headaches. It is a monoclonal antibody. This medicine may be used for other purposes; ask your health care provider or pharmacist if you have questions. COMMON BRAND NAME(S): Emgality What should I tell my care team before I take this medication? They need to know if you have any of these conditions: An unusual or allergic reaction to galcanezumab, other medications, foods, dyes, or preservatives Pregnant or trying to get pregnant Breast-feeding How should I use this medication? This medication is injected under the skin. You will be taught how to prepare and give it. Take it as directed on the prescription label. Keep taking it unless your care team tells you to stop. It is important that you put your used needles and syringes in a special sharps container. Do not put them in a trash can. If you do not have a sharps container, call your pharmacist or care team to get one. Talk to your care team about the use of this medication in children. Special care may be needed. Overdosage: If you think you have taken too much of this medicine contact a poison control center or emergency room at once. NOTE: This medicine is only for you. Do not share this medicine with others. What if I miss a dose? If you miss a dose, take it as soon as you can. If it is almost time for your next dose, take only that dose. Do not take double or extra doses. What may interact with this medication? Interactions are not expected. This list may not describe all possible interactions. Give your health care provider a list of all the medicines, herbs,  non-prescription drugs, or dietary supplements you use. Also tell them if you smoke, drink alcohol, or use illegal drugs. Some items may interact with your medicine. What should I watch for while using this medication? Visit your care team for regular checks on your progress. Tell your care team if your symptoms do not start to get better or if they  get worse. What side effects may I notice from receiving this medication? Side effects that you should report to your care team as soon as possible: Allergic reactions or angioedema--skin rash, itching or hives, swelling of the face, eyes, lips, tongue, arms, or legs, trouble swallowing or breathing Side effects that usually do not require medical attention (report to your care team if they continue or are bothersome): Pain, redness, or irritation at injection site This list may not describe all possible side effects. Call your doctor for medical advice about side effects. You may report side effects to FDA at 1-800-FDA-1088. Where should I keep my medication? Keep out of the reach of children and pets. Store in a refrigerator or at room temperature between 20 and 25 degrees C (68 and 77 degrees F). Refrigeration (preferred): Store in the refrigerator. Do not freeze. Keep in the original container until you are ready to take it. Remove the dose from the carton about 30 minutes before it is time for you to use it. If the dose is not used, it may be stored in original container at room temperature for 7 days. Get rid of any unused medication after the expiration date. Room Temperature: This medication may be stored at room temperature for up to 7 days. Keep it in the original container. Protect from light until time of use. If it is stored at room temperature, get rid of any unused medication after 7 days or after it expires, whichever is first. To get rid of medications that are no longer needed or have expired: Take the medication to a medication take-back  program. Check with your pharmacy or law enforcement to find a location. If you cannot return the medication, ask your pharmacist or care team how to get rid of this medication safely. NOTE: This sheet is a summary. It may not cover all possible information. If you have questions about this medicine, talk to your doctor, pharmacist, or health care provider.  2024 Elsevier/Gold Standard (2021-06-11 00:00:00)

## 2023-10-03 NOTE — Progress Notes (Signed)
 [facility]    Provider:  Dr Tresia Fruit Requesting Provider: Bufford Carne, FNP Primary Care Provider:  Bufford Carne, FNP  CC: headaches:  worsening migraines: pt stated that she has daily ha's with varying intensity. Pt stated that they occasionally or one sided being an eye like an ice pick to head. Occasionally starts at base skull and radiates through whole head and in the deepest part to the middle of brain.   HPI:  Megan Stone is a 33 y.o. female here as requested by Bufford Carne, FNP for daily headache/migraines. has Chronic migraine with aura without status migrainosus, not intractable and Chronic migraine without aura without status migrainosus, not intractable on their problem list. She had her first migraine at the age of 66. Waxed and wained over the years. Worse around puberty 45 and they can stay prevalent for a while and then go away. Grandmother had them. Rizatriptan is not workng. They are becomng mre frequent in February and have not let up. 2 different ones one that is an ice pick behind her eyes and rizatriptan and cold packs can help but she has angry headache that start at the base of head to the center of her brain and makes everything feel swollen, pulsating/pounding/throbbing, light sensitivity, sound too, nausea and vomiting, hurts to move, she gets the smell of cigarette smoke as aura. Not on birth control uses an app called glow to know when she has her period always has a migraine in her luteal phase. Can be severe, has had to go to the hospital because it has been the third day 24-72 hours, worsening, hurts to move can be positional and can wake her up vernight supine and they are the worst. She has blurry vision with the headaches. A dark room helps with cool, unknown triggers other than luteal phase. She has 12 moderate to severe days of migraines a month, more than half the days of the month are migraines. She is exercising, eating well, lost  weight.   Reviewed notes, labs and imaging from outside physicians, which showed:  From a thorough review of records and patient report, Medications tried that can be used in migraine/headache management greater than 3 months include: Lifestyle modification, headache diaries, better sleep hygiene, exercise, management of migraine triggers, OTC and prescribed analgesics/nsaids such as ibuprofen, excedrin, alleve and otherx, Aimovig contraindicated due to constipation(on probiotic and zepbound ). Imitres, Maxalt. Propranolol. Seroquel. On pristiq so amitrip/nortriptyline contraindicated due to risk of seratonin syndomre but also tried amitriptyline in the past, topamax.   Ct head 7/1/2021showed No acute intracranial abnormalities including mass lesion or mass effect, hydrocephalus, extra-axial fluid collection, midline shift, hemorrhage, or acute infarction, large ischemic events (personally reviewed images)    Review of Systems: Patient complains of symptoms per HPI as well as the following symptoms intentional weight loss. Pertinent negatives and positives per HPI. All others negative.   Social History   Socioeconomic History   Marital status: Married    Spouse name: andrew   Number of children: 2   Years of education: Not on file   Highest education level: Bachelor's degree (e.g., BA, AB, BS)  Occupational History   Not on file  Tobacco Use   Smoking status: Never    Passive exposure: Never   Smokeless tobacco: Not on file  Vaping Use   Vaping status: Never Used  Substance and Sexual Activity   Alcohol use: Yes    Alcohol/week: 1.0 standard drink of alcohol  Types: 1 Glasses of wine per week    Comment: occasional   Drug use: No   Sexual activity: Yes    Birth control/protection: None  Other Topics Concern   Not on file  Social History Narrative   Not on file   Social Drivers of Health   Financial Resource Strain: Not on file  Food Insecurity: Not on file   Transportation Needs: Not on file  Physical Activity: Not on file  Stress: Not on file  Social Connections: Not on file  Intimate Partner Violence: Not on file    Family History  Problem Relation Age of Onset   Stroke Father    Dementia Maternal Grandmother    Emphysema Maternal Grandmother    COPD Maternal Grandmother    Stroke Maternal Grandfather    Mental retardation Paternal Grandmother    Migraines Paternal Grandmother    Sleep apnea Paternal Grandmother    Mental retardation Paternal Grandfather     Past Medical History:  Diagnosis Date   Migraines     Patient Active Problem List   Diagnosis Date Noted   Chronic migraine with aura without status migrainosus, not intractable 10/03/2023   Chronic migraine without aura without status migrainosus, not intractable 10/03/2023    Past Surgical History:  Procedure Laterality Date   NO PAST SURGERIES      Current Outpatient Medications  Medication Sig Dispense Refill   ALPRAZolam (XANAX) 0.25 MG tablet Take 1-2 tabs (0.25mg -0.50mg ) 30-60 minutes before procedure. May repeat if needed.Do not drive. 4 tablet 0   buPROPion (WELLBUTRIN SR) 200 MG 12 hr tablet Take 200 mg by mouth every morning.     clonazePAM (KLONOPIN) 0.5 MG tablet Take 0.5 mg by mouth every 8 (eight) hours as needed.     cyclobenzaprine (FLEXERIL) 10 MG tablet Take 10 mg by mouth daily as needed.     famotidine  (PEPCID ) 20 MG tablet Take 1 tablet (20 mg total) by mouth 2 (two) times daily. 60 tablet 0   Galcanezumab-gnlm (EMGALITY) 120 MG/ML SOAJ Inject 240 mg into the skin once for 1 dose. Loading dose fill first. This is first month dose inject two. Every month thereafter inject once monthly. Please use copay card: BIN [id number] PCN PDMI GRP [id number] ID [id number] EXP 04/28/2024 2 mL 0   MAGNESIUM PO Take 500 mg by mouth daily.     Multiple Vitamin (MULTIVITAMIN) tablet Take 1 tablet by mouth daily.     omeprazole  (PRILOSEC) 20 MG capsule Take 1 capsule  (20 mg total) by mouth daily. 30 capsule 0   ondansetron  (ZOFRAN  ODT) 4 MG disintegrating tablet Take 1 tablet (4 mg total) by mouth every 8 (eight) hours as needed for nausea or vomiting. 20 tablet 0   phentermine (ADIPEX-P) 37.5 MG tablet Take 37.5 mg by mouth daily before breakfast.     PRISTIQ 100 MG 24 hr tablet Take 150 mg by mouth daily.     Rimegepant Sulfate (NURTEC) 75 MG TBDP Take 1 tablet (75 mg total) by mouth daily as needed. For migraines. Take as close to onset of migraine as possible. One daily maximum.     SEROQUEL 200 MG tablet Take 200 mg by mouth at bedtime.     tirzepatide  (ZEPBOUND ) 15 MG/0.5ML Pen Inject 15 mg into the skin once a week. 2 mL 0   zolmitriptan (ZOMIG) 5 MG tablet Take 1 tablet (5 mg total) by mouth as needed for migraine. Take at onset and can repeat 2 hours later  12 tablet 11   No current facility-administered medications for this visit.    Allergies as of 10/03/2023   (No Known Allergies)    Vitals: BP 125/89   Pulse (!) 101   Ht 5\' 6"  (1.676 m)   Wt 200 lb 6.4 oz (90.9 kg)   LMP 10/03/2023   BMI 32.35 kg/m  Last Weight:  Wt Readings from Last 1 Encounters:  10/03/23 200 lb 6.4 oz (90.9 kg)   Last Height:   Ht Readings from Last 1 Encounters:  10/03/23 5\' 6"  (1.676 m)     Physical exam: Exam: Gen: NAD, conversant, well nourised, obese, well groomed                     CV: RRR, no MRG. No Carotid Bruits. No peripheral edema, warm, nontender Eyes: Conjunctivae clear without exudates or hemorrhage  Neuro: Detailed Neurologic Exam  Speech:    Speech is normal; fluent and spontaneous with normal comprehension.  Cognition:    The patient is oriented to person, place, and time;     recent and remote memory intact;     language fluent;     normal attention, concentration,     fund of knowledge Cranial Nerves:    The pupils are equal, round, and reactive to light. She gets yearly exams of her eyes, difficulty to see fundi due to  contacts strong but no papilledema noted.. Visual fields are full to finger confrontation. Extraocular movements are intact. Trigeminal sensation is intact and the muscles of mastication are normal. The face is symmetric. The palate elevates in the midline. Hearing intact. Voice is normal. Shoulder shrug is normal. The tongue has normal motion without fasciculations.   Coordination: nml  Gait: nml  Motor Observation:    No asymmetry, no atrophy, and no involuntary movements noted. Tone:    Normal muscle tone.    Posture:    Posture is normal. normal erect    Strength:    Strength is V/V in the upper and lower limbs.      Sensation: intact to LT     Reflex Exam:  DTR's:    Deep tendon reflexes in the upper and lower extremities are normal bilaterally.   Toes:    The toes are downgoing bilaterally.   Clonus:    Clonus is absent.    Assessment/Plan:  Patient with chronic migraines. Also spent time education of migraines and migraine management, medication choices, acute vs prevention, strategies. Menstrually related.  Discussed teratogenicity with cgrp class do not get pregnant recommend backup Prevention: Emgality (Other shot is Ajovy) Acute: Zomig Catch early. If you know you will have the migraine during a time period like menses you can pretreat.  Nurtec : daily as needed for migraine, samples, when <8 migraine days a month and < 12 total headache days a month can prescribe if effective MRI of the brain - [place] , xanax: MRI brain due to concerning symptoms of nocturnal headaches, positional headaches,vision and smell changes(olfactory hallucinations, worsening headaches  to look for space occupying mass, chiari or intracranial hypertension (pseudotumor), strokes, malignancies, vasculidities, demyelination(multiple sclerosis), seizure focus or other   Also spent time education of migraines and migraine management, medication choices, acute vs prevention, strategies as well  as below To prevent or relieve headaches, try the following: Cool Compress. Lie down and place a cool compress on your head.  Avoid headache triggers. If certain foods or odors seem to have triggered your migraines in the  past, avoid them. A headache diary might help you identify triggers.  Include physical activity in your daily routine. Try a daily walk or other moderate aerobic exercise.  Manage stress. Find healthy ways to cope with the stressors, such as delegating tasks on your to-do list.  Practice relaxation techniques. Try deep breathing, yoga, massage and visualization.  Eat regularly. Eating regularly scheduled meals and maintaining a healthy diet might help prevent headaches. Also, drink plenty of fluids.  Follow a regular sleep schedule. Sleep deprivation might contribute to headaches   Proceed to emergency room if you experience new or worsening symptoms or symptoms do not resolve, if you have new neurologic symptoms or if headache is severe, or for any concerning symptom.   Provided education and documentation including informational packet on: migraines, symptoms as well as aura aura, what causes migraines, migraine triggers, chronic migraine medication overuse headache, chronic migraines, prevention of migraines both acute and preventative and the different choices and classes, nondrug treatments, behavioral and other nonpharmacologic treatments for headache.    Orders Placed This Encounter  Procedures   MR BRAIN W WO CONTRAST   Meds ordered this encounter  Medications   Galcanezumab-gnlm (EMGALITY) 120 MG/ML SOAJ    Sig: Inject 240 mg into the skin once for 1 dose. Loading dose fill first. This is first month dose inject two. Every month thereafter inject once monthly. Please use copay card: BIN [id number] PCN PDMI GRP [id number] ID [id number] EXP 04/28/2024    Dispense:  2 mL    Refill:  0    Loading dose fill first. This is first month dose inject two. Every month thereafter  inject once monthly. Please use copay card: BIN [id number] PCN PDMI GRP [id number] ID [id number] EXP 04/28/2024   Rimegepant Sulfate (NURTEC) 75 MG TBDP    Sig: Take 1 tablet (75 mg total) by mouth daily as needed. For migraines. Take as close to onset of migraine as possible. One daily maximum.   zolmitriptan (ZOMIG) 5 MG tablet    Sig: Take 1 tablet (5 mg total) by mouth as needed for migraine. Take at onset and can repeat 2 hours later    Dispense:  12 tablet    Refill:  11   ALPRAZolam (XANAX) 0.25 MG tablet    Sig: Take 1-2 tabs (0.25mg -0.50mg ) 30-60 minutes before procedure. May repeat if needed.Do not drive.    Dispense:  4 tablet    Refill:  0    Cc: Bufford Carne, FNP,  Bufford Carne, FNP  Aldona Amel, MD  [facility] [address] [place], [place] [address]  Phone [phone] Fax [phone]

## 2023-10-03 NOTE — Telephone Encounter (Signed)
 Pharmacy Patient Advocate Encounter   Received notification from CoverMyMeds that prior authorization for Emgality 120MG /ML auto-injectors (migraine) is required/requested.   Insurance verification completed.   The patient is insured through [facility] .   Per test claim: PA required; PA submitted to above mentioned insurance via CoverMyMeds Key/confirmation #/EOC [id number] Status is pending

## 2023-10-06 ENCOUNTER — Telehealth: Payer: Self-pay | Admitting: Neurology

## 2023-10-06 NOTE — Telephone Encounter (Signed)
 [facility] NPR Case Number: [id number] sent to [facility] [phone]

## 2023-10-07 ENCOUNTER — Other Ambulatory Visit (HOSPITAL_COMMUNITY): Payer: Self-pay

## 2023-10-07 NOTE — Telephone Encounter (Signed)
 Pharmacy Patient Advocate Encounter  Received notification from [facility] that Prior Authorization for Emgality  120MG /ML auto-injectors (migraine) has been APPROVED from 10/03/2023 to 10/02/2024. Ran test claim, Copay is $35.00. This test claim was processed through [facility]- copay amounts may vary at other pharmacies due to pharmacy/plan contracts, or as the patient moves through the different stages of their insurance plan.   PA #/Case ID/Reference #: [id number]

## 2023-10-13 ENCOUNTER — Ambulatory Visit: Admission: RE | Admit: 2023-10-13 | Payer: Self-pay | Source: Ambulatory Visit

## 2023-10-15 ENCOUNTER — Telehealth: Payer: Self-pay | Admitting: Neurology

## 2023-10-15 NOTE — Telephone Encounter (Signed)
 Pt called in regards to medication   Emgality  . Pt states Pharmacy  is saying that they can not release medication informed pt that as of 6-10 emagality was approved .[name] PT  would like  to know what's goin gon

## 2023-10-22 ENCOUNTER — Encounter: Payer: Self-pay | Admitting: Neurology

## 2023-10-22 ENCOUNTER — Other Ambulatory Visit: Payer: Self-pay | Admitting: *Deleted

## 2023-10-22 DIAGNOSIS — G43709 Chronic migraine without aura, not intractable, without status migrainosus: Secondary | ICD-10-CM

## 2023-10-22 MED ORDER — EMGALITY 120 MG/ML ~~LOC~~ SOAJ: 240.0000 mg | Freq: Once | SUBCUTANEOUS | 0 refills | Status: AC

## 2023-10-22 MED ORDER — EMGALITY 120 MG/ML ~~LOC~~ SOAJ: SUBCUTANEOUS | 11 refills | Status: DC

## 2023-10-28 ENCOUNTER — Telehealth: Payer: Self-pay | Admitting: Neurology

## 2023-10-28 NOTE — Telephone Encounter (Signed)
 Pt called in regards to medication (Galcanezumab -gnlm (EMGALITY ) 120 MG/ML SOAJ ) . Pt states that  MD  was suppose to send  order for starter dosage for  pt medication . Pharmacy only filled medication for 1 injection . Pt would like to speak to  Md or nurse  about the correct dosage , I Explained to Pt that medication is showing  120 mg injection  in chart . And Medication is ready to be picked up at Pharmacy as of 6-25 . Pt would like to speak to MD before to get clarity  on Starter dosage  being  240 mg .

## 2023-10-29 ENCOUNTER — Other Ambulatory Visit (HOSPITAL_COMMUNITY): Payer: Self-pay

## 2023-10-29 MED ORDER — EMGALITY 120 MG/ML ~~LOC~~ SOAJ: 240.0000 mg | Freq: Once | SUBCUTANEOUS | 0 refills | Status: AC

## 2023-10-29 MED ORDER — ZEPBOUND 15 MG/0.5ML ~~LOC~~ SOAJ
15.0000 mg | SUBCUTANEOUS | 0 refills | Status: DC
  Filled 2023-10-29: qty 2, 28d supply, fill #0

## 2023-10-29 NOTE — Addendum Note (Signed)
 Addended by: Jad Johansson B on: 10/29/2023 08:15 AM   Modules accepted: Orders

## 2023-10-29 NOTE — Telephone Encounter (Signed)
 I sent in the loading dose thanks

## 2023-10-29 NOTE — Telephone Encounter (Signed)
 noted

## 2023-10-29 NOTE — Telephone Encounter (Signed)
 Pt has called for an update on her Emgality , phone rep was able to connect call to RN

## 2023-10-30 ENCOUNTER — Other Ambulatory Visit (HOSPITAL_COMMUNITY): Payer: Self-pay

## 2023-10-30 ENCOUNTER — Telehealth: Payer: Self-pay

## 2023-10-30 NOTE — Telephone Encounter (Signed)
 I called pt and let her know that she can start the emgality  120mg  now.  I explained issue with PA and that I contacted the pharmacy, soonest she can pick up 11-08-2023.  New PA done for 2 syringes although was done initially per PA team.  I apologized for delay.  She will go ahead and get started was appreciative of call.  Will call back with questions/ issues.

## 2023-10-30 NOTE — Telephone Encounter (Signed)
 I called pharmacy after receiving PA request that they did use quantity 2 for PA.  Pharmacy said that earliest that pt can pick up her another 120mg  would be 11-08-2023 since pt picked up initial syringe.  Ok for pt to take 1 syringe at start?  So she can get started on treatment.

## 2023-10-30 NOTE — Telephone Encounter (Signed)
 Pharmacy Patient Advocate Encounter   Received notification from Physician's Office that prior authorization for Emgality  120MG /ML auto-injectors (migraine) is required/requested.   Insurance verification completed.   The patient is insured through [facility] .   Per test claim: PA required; PA submitted to above mentioned insurance via CoverMyMeds Key/confirmation #/EOC [id number] Status is pending  PA submitted for loading dose. When I submitted the original PA it was also for the loading dose-see below

## 2023-11-03 ENCOUNTER — Other Ambulatory Visit (HOSPITAL_COMMUNITY): Payer: Self-pay

## 2023-11-03 NOTE — Telephone Encounter (Signed)
 SABRA

## 2023-11-04 NOTE — Telephone Encounter (Signed)
 Pt has started the emgality  120mg , see other phone message.

## 2023-12-22 ENCOUNTER — Other Ambulatory Visit (HOSPITAL_COMMUNITY): Payer: Self-pay

## 2023-12-22 MED ORDER — ZEPBOUND 15 MG/0.5ML ~~LOC~~ SOAJ
15.0000 mg | SUBCUTANEOUS | 3 refills | Status: DC
  Filled 2023-12-22: qty 2, 28d supply, fill #0

## 2023-12-26 ENCOUNTER — Other Ambulatory Visit (HOSPITAL_COMMUNITY): Payer: Self-pay

## 2024-02-19 ENCOUNTER — Other Ambulatory Visit (HOSPITAL_COMMUNITY): Payer: Self-pay

## 2024-02-19 MED ORDER — ZEPBOUND 15 MG/0.5ML ~~LOC~~ SOAJ
15.0000 mg | SUBCUTANEOUS | 3 refills | Status: AC
  Filled 2024-02-19 – 2024-03-05 (×2): qty 2, 28d supply, fill #0

## 2024-02-24 ENCOUNTER — Telehealth: Admitting: Neurology

## 2024-03-02 ENCOUNTER — Other Ambulatory Visit (HOSPITAL_COMMUNITY): Payer: Self-pay

## 2024-03-05 ENCOUNTER — Other Ambulatory Visit (HOSPITAL_COMMUNITY): Payer: Self-pay

## 2024-03-30 ENCOUNTER — Encounter: Payer: Self-pay | Admitting: Neurology

## 2024-03-30 ENCOUNTER — Ambulatory Visit: Admitting: Neurology

## 2024-03-30 DIAGNOSIS — G43E09 Chronic migraine with aura, not intractable, without status migrainosus: Secondary | ICD-10-CM | POA: Diagnosis not present

## 2024-03-30 DIAGNOSIS — G43709 Chronic migraine without aura, not intractable, without status migrainosus: Secondary | ICD-10-CM | POA: Diagnosis not present

## 2024-03-30 MED ORDER — EMGALITY 120 MG/ML ~~LOC~~ SOAJ: SUBCUTANEOUS | 11 refills | Status: DC

## 2024-03-30 MED ORDER — PROPRANOLOL HCL 20 MG PO TABS: 20.0000 mg | ORAL_TABLET | Freq: Two times a day (BID) | ORAL | 6 refills | Status: AC

## 2024-03-30 MED ORDER — ZOLMITRIPTAN 5 MG PO TABS: 5.0000 mg | ORAL_TABLET | ORAL | 11 refills | Status: DC | PRN

## 2024-03-30 NOTE — Progress Notes (Signed)
 [facility]  Provider:  Dr Jerold Yoss Referring Provider: Dyane Anthony RAMAN, FNP Primary Care Physician:  Dyane Anthony RAMAN, FNP  Chief Complaint  Patient presents with   Migraine    Rm 1 alone Pt is well and stable, reports positive response to medication management. May have one headache a week. Has not had a migraine in months.   No new concerns.     HPI:  Megan Stone is a 33 y.o. female and seen here on 03/30/2024 upon [facility] from Dr Ines, original referral from Dr. Dyane for a Consultation/ Evaluation of migraine.   The patient had seen Dr Ines  who started Emgality  and this has worked well for her.  Below is the description and extensive headache history as obtained by Dr Ines.  She had cluster headaches at night and migraines in daytime . More headache days than headache free days until treatment with Emgality . She uses propanolol and zomig  as well.    This patient reports for the first time a headache/ migraine free period of several months,  and she had some headache free time  during her pregnancies.    CC: headaches:  worsening migraines: pt stated that she has daily ha's with varying intensity. Pt stated that they occasionally or one sided being an eye like an ice pick to head.  Occasionally starts at base skull and radiates through whole head and in the deepest part to the middle of brain.    HPI:  Megan Stone is a 33 y.o. female here as requested by Dyane Anthony RAMAN, FNP for daily headache/migraines. has Chronic migraine with aura without status migrainosus, not intractable and Chronic migraine without aura without status migrainosus, not intractable on their problem list. She had her first migraine at the age of 4. Waxed and wained over the years. Worse around puberty 42 and they can stay prevalent for a while and then go away. Grandmother had them. Rizatriptan is not workng. They are becomng mre frequent in February and have not let up. 2 different ones one  that is an ice pick behind her eyes and rizatriptan and cold packs can help but she has angry headache that start at the base of head to the center of her brain and makes everything feel swollen, pulsating/pounding/throbbing, light sensitivity, sound too, nausea and vomiting, hurts to move, she gets the smell of cigarette smoke as aura. Not on birth control uses an app called glow to know when she has her period always has a migraine in her luteal phase. Can be severe, has had to go to the hospital because it has been the third day 24-72 hours, worsening, hurts to move can be positional and can wake her up vernight supine and they are the worst. She has blurry vision with the headaches. A dark room helps with cool, unknown triggers other than ovulation / luteal phase.  She has 12 moderate to severe days of migraines a month, more than half the days of the month are migraines.   She is exercising, eating well, lost weight.    Reviewed notes, labs and imaging from outside physicians, which showed:   From a thorough review of records and patient report, Medications tried that can be used in migraine/headache management greater than 3 months include: Lifestyle modification, headache diaries, better sleep hygiene, exercise, management of migraine triggers, OTC and prescribed analgesics/nsaids such as ibuprofen, excedrin, alleve and otherx, Aimovig contraindicated due to constipation(on probiotic and zepbound ). Imitres, Maxalt. Propranolol. Seroquel. On pristiq  so amitrip/nortriptyline contraindicated due to risk of seratonin syndomre but also tried amitriptyline in the past, topamax.    Ct head 7/1/2021showed No acute intracranial abnormalities including mass lesion or mass effect, hydrocephalus, extra-axial fluid collection, midline shift, hemorrhage, or acute infarction, large ischemic events (personally reviewed images)  Review of Systems: Out of a complete 14 system review, the patient complains of only  the following symptoms, and all other reviewed systems are negative. reports positive response to medication management. May have one headache a week. Has not had a migraine in months. No new concerns.   Social History   Socioeconomic History   Marital status: Married    Spouse name: andrew   Number of children: 2   Years of education: Not on file   Highest education level: Bachelor's degree (e.g., BA, AB, BS)  Occupational History   Not on file  Tobacco Use   Smoking status: Never    Passive exposure: Never   Smokeless tobacco: Not on file  Vaping Use   Vaping status: Never Used  Substance and Sexual Activity   Alcohol use: Yes    Alcohol/week: 1.0 standard drink of alcohol    Types: 1 Glasses of wine per week    Comment: occasional   Drug use: No   Sexual activity: Yes    Birth control/protection: None  Other Topics Concern   Not on file  Social History Narrative   Not on file   Social Drivers of Health   Financial Resource Strain: Not on file  Food Insecurity: Not on file  Transportation Needs: Not on file  Physical Activity: Not on file  Stress: Not on file  Social Connections: Not on file  Intimate Partner Violence: Not on file    Family History  Problem Relation Age of Onset   Stroke Father    Dementia Maternal Grandmother    Emphysema Maternal Grandmother    COPD Maternal Grandmother    Stroke Maternal Grandfather    Mental retardation Paternal Grandmother    Migraines Paternal Grandmother    Sleep apnea Paternal Grandmother    Mental retardation Paternal Grandfather     Past Medical History:  Diagnosis Date   Migraines     Past Surgical History:  Procedure Laterality Date   NO PAST SURGERIES      Current Outpatient Medications  Medication Sig Dispense Refill   buPROPion (WELLBUTRIN SR) 200 MG 12 hr tablet Take 200 mg by mouth every morning. (Patient taking differently: Take 350 mg by mouth every morning.)     clonazePAM (KLONOPIN) 0.5 MG  tablet Take 0.5 mg by mouth every 8 (eight) hours as needed.     cyclobenzaprine (FLEXERIL) 10 MG tablet Take 10 mg by mouth daily as needed.     Galcanezumab -gnlm (EMGALITY ) 120 MG/ML SOAJ Inject into skin every 30 days. 1.12 mL 11   MAGNESIUM PO Take 500 mg by mouth daily.     Multiple Vitamin (MULTIVITAMIN) tablet Take 1 tablet by mouth daily.     ondansetron  (ZOFRAN  ODT) 4 MG disintegrating tablet Take 1 tablet (4 mg total) by mouth every 8 (eight) hours as needed for nausea or vomiting. 20 tablet 0   phentermine (ADIPEX-P) 37.5 MG tablet Take 37.5 mg by mouth daily before breakfast.     PRISTIQ 100 MG 24 hr tablet Take 150 mg by mouth daily.     Rimegepant Sulfate (NURTEC) 75 MG TBDP Take 1 tablet (75 mg total) by mouth daily as needed. For migraines. Take  as close to onset of migraine as possible. One daily maximum.     SEROQUEL 200 MG tablet Take 200 mg by mouth at bedtime.     tirzepatide  (ZEPBOUND ) 15 MG/0.5ML Pen Inject 15 mg into the skin once a week. 2 mL 3   tirzepatide  (ZEPBOUND ) 15 MG/0.5ML Pen Inject 15 mg into the skin once a week. 2 mL 3   zolmitriptan  (ZOMIG ) 5 MG tablet Take 1 tablet (5 mg total) by mouth as needed for migraine. Take at onset and can repeat 2 hours later 12 tablet 11   No current facility-administered medications for this visit.    Allergies as of 03/30/2024   (No Known Allergies)    Vitals: BP (!) 130/91   Pulse 90   Ht 5' 7 (1.702 m)   Wt 178 lb (80.7 kg)   BMI 27.88 kg/m   Physical exam:  General: The patient is awake, alert and appears not in acute distress.  The patient is well groomed. Head: Normocephalic, atraumatic.  Neck is supple.  Neck circumference:13. 75  Cardiovascular:  Regular rate and palpable peripheral pulse:  Respiratory: clear to auscultation.  Mallampati 1, Skin:  Without evidence of edema, or rash Trunk: slender , BMI 27. 9   Neurologic exam : The patient is awake and alert, oriented to place and time.   Memory  subjective  described as intact.  There is a normal attention span & concentration ability.  Speech is fluent without  dysarthria, dysphonia or aphasia.  Mood and affect are appropriate.  Cranial nerves: Pupils are equal and briskly reactive to light.  Funduscopic exam without evidence of pallor or edema.  Extraocular movements  in vertical and horizontal planes intact and without nystagmus. Visual fields by finger perimetry are intact. Hearing to finger rub intact.  Facial sensation intact to fine touch. Facial motor strength is symmetric and tongue and uvula move midline.  Motor exam:   Normal tone and normal muscle bulk and symmetric normal strength in all extremities. Grip Strength equal  Proximal strength of shoulder muscles and hip flexors was intact .  Sensory:   normal.  Coordination: Rapid alternating movements in the fingers/hands were normal.  Finger-to-nose maneuver was tested and showed no evidence of ataxia, dysmetria or tremor.  Gait and station: Patient walked with/ without assistive device .  Core Strength within normal limits.  Stance is stable and of wide/ normal. Base.    Deep tendon reflexes: in the  upper and lower extremities are symmetric    Assessment: Total time for face to face interview and examination, for review of  images and laboratory testing, neurophysiology testing and pre-existing records, including out-of -network , was 35 minutes. Assessment is as follows here:  1) Migraine headaches since teenage, catamenial pattern, improved in pregnancy abut worsen under birth control hormones.  In her teenage she took imitrex for abortive.    2)  refilled Emgality  for 30 day cycle with 11 refills.   Plan:  Treatment plan and additional workup planned after today includes:   1) Emgality  refill. Times 11  2) Zomig  for break through refilled.    Continue magnesium glyconate supplement    RV in 12 months with NP / MD   The patient's condition  requires frequent monitoring and adjustments in the treatment plan, reflecting the ongoing complexity of care.  This provider is the continuing focal point for all needed services for this condition.   Dedra Gores, MD  [facility] [place]  Sleep Board certified by The [employer] of Sleep Medicine and Diplomate of the American Academy of Sleep Medicine. Board certified In Neurology through the ABPN, Fellow of the [employer] of Neu[employer].

## 2024-04-18 ENCOUNTER — Encounter: Payer: Self-pay | Admitting: Neurology

## 2024-05-07 MED ORDER — NURTEC 75 MG PO TBDP: 75.0000 mg | ORAL_TABLET | Freq: Every day | ORAL | 1 refills | Status: DC | PRN

## 2024-05-07 NOTE — Addendum Note (Signed)
 Addended by: CHALICE SAUNAS on: 05/07/2024 04:28 PM   Modules accepted: Orders

## 2024-05-09 ENCOUNTER — Other Ambulatory Visit: Payer: Self-pay

## 2024-05-09 ENCOUNTER — Emergency Department: Admission: EM | Admit: 2024-05-09 | Discharge: 2024-05-09 | Disposition: A | Discharge status: Home / Self Care

## 2024-05-09 ENCOUNTER — Encounter: Payer: Self-pay | Admitting: Neurology

## 2024-05-09 DIAGNOSIS — R519 Headache, unspecified: Secondary | ICD-10-CM | POA: Diagnosis present

## 2024-05-09 DIAGNOSIS — G43909 Migraine, unspecified, not intractable, without status migrainosus: Secondary | ICD-10-CM | POA: Insufficient documentation

## 2024-05-09 LAB — BASIC METABOLIC PANEL WITH GFR
Anion gap: 10 (ref 5–15)
BUN: 13 mg/dL (ref 6–20)
CO2: 25 mmol/L (ref 22–32)
Calcium: 8.5 mg/dL — ABNORMAL LOW (ref 8.9–10.3)
Chloride: 104 mmol/L (ref 98–111)
Creatinine, Ser: 0.77 mg/dL (ref 0.44–1.00)
GFR, Estimated: >60 mL/min
Glucose, Bld: 67 mg/dL — ABNORMAL LOW (ref 70–99)
Potassium: 3.7 mmol/L (ref 3.5–5.1)
Sodium: 139 mmol/L (ref 135–145)

## 2024-05-09 LAB — CBC WITH DIFFERENTIAL/PLATELET
Abs Immature Granulocytes: 0.01 K/uL (ref 0.00–0.07)
Basophils Absolute: 0 K/uL (ref 0.0–0.1)
Basophils Relative: 1 %
Eosinophils Absolute: 0.2 K/uL (ref 0.0–0.5)
Eosinophils Relative: 4 %
HCT: 39.9 % (ref 36.0–46.0)
Hemoglobin: 13.4 g/dL (ref 12.0–15.0)
Immature Granulocytes: 0 %
Lymphocytes Relative: 27 %
Lymphs Abs: 1.6 K/uL (ref 0.7–4.0)
MCH: 28.7 pg (ref 26.0–34.0)
MCHC: 33.6 g/dL (ref 30.0–36.0)
MCV: 85.4 fL (ref 80.0–100.0)
Monocytes Absolute: 0.4 K/uL (ref 0.1–1.0)
Monocytes Relative: 8 %
Neutro Abs: 3.5 K/uL (ref 1.7–7.7)
Neutrophils Relative %: 60 %
Platelets: 333 K/uL (ref 150–400)
RBC: 4.67 MIL/uL (ref 3.87–5.11)
RDW: 11.9 % (ref 11.5–15.5)
WBC: 5.8 K/uL (ref 4.0–10.5)
nRBC: 0 % (ref 0.0–0.2)

## 2024-05-09 LAB — HCG, QUANTITATIVE, PREGNANCY: hCG, Beta Chain, Quant, S: <1 m[IU]/mL

## 2024-05-09 MED ORDER — METOCLOPRAMIDE HCL 5 MG/ML IJ SOLN
10.0000 mg | Freq: Once | INTRAMUSCULAR | Status: AC
  Administered 2024-05-09: 10 mg via INTRAVENOUS
  Filled 2024-05-09: qty 2

## 2024-05-09 MED ORDER — SODIUM CHLORIDE 0.9 % IV BOLUS
1000.0000 mL | Freq: Once | INTRAVENOUS | Status: AC
  Administered 2024-05-09: 1000 mL via INTRAVENOUS

## 2024-05-09 MED ORDER — KETOROLAC TROMETHAMINE 15 MG/ML IJ SOLN
15.0000 mg | Freq: Once | INTRAMUSCULAR | Status: AC
  Administered 2024-05-09: 15 mg via INTRAVENOUS
  Filled 2024-05-09: qty 1

## 2024-05-09 MED ORDER — ACETAMINOPHEN 500 MG PO TABS
1000.0000 mg | ORAL_TABLET | Freq: Once | ORAL | Status: AC
  Administered 2024-05-09: 1000 mg via ORAL
  Filled 2024-05-09: qty 2

## 2024-05-09 MED ORDER — DIPHENHYDRAMINE HCL 50 MG/ML IJ SOLN
25.0000 mg | Freq: Once | INTRAMUSCULAR | Status: AC
  Administered 2024-05-09: 25 mg via INTRAVENOUS
  Filled 2024-05-09: qty 1

## 2024-05-09 NOTE — ED Triage Notes (Signed)
 Pt comes with c/o migraine for week now. Pt states she is on meds and it has been working good til now.

## 2024-05-09 NOTE — Discharge Instructions (Signed)
 Your evaluation in the emergency department was overall reassuring, and I believe he had a migraine headache.  Fortunately this improved with treatment.  Continue with your usual migraine medication regimen, and you can use Tylenol  and Motrin in addition as needed for any ongoing discomfort.  Drinking plenty of water and getting good rest can also help with your symptoms.  Please follow-up with your neurologist for reevaluation, and return to the emergency department with any new or worsening symptoms.

## 2024-05-09 NOTE — ED Provider Notes (Signed)
 "  [facility] Provider Note    Event Date/Time   First MD Initiated Contact with Patient 05/09/24 1849     (approximate)   History   Migraine  Pt comes with c/o migraine for week now. Pt states she is on meds and it has been working good til now.    HPI Megan Stone is a 34 y.o. female PMH migraines presents for evaluation of headache - Patient suffers from chronic migraines.  Has been taking monthly medication for this and Zomig  for breakthrough but has been having about 1 week of typical migraine symptoms that unfortunately are not improving.  Bilateral headache extends down to superlow region and upper neck which is not abnormal for her.  No fever, altered mentation, rash. - No preceding trauma - Not on blood thinners - No focal weakness - Some photophobia.  No nausea.  No vision changes.  Per chart review, seen by neurology in clinic on 03/30/2024 for migraines.  Continued on Emgality  and also Rx Zomig  for breakthrough migraines.  Last CT head in 2021, unremarkable, no structural abnormalities.     Physical Exam   Triage Vital Signs: ED Triage Vitals  Encounter Vitals Group     BP 05/09/24 1635 100/78     Girls Systolic BP Percentile --      Girls Diastolic BP Percentile --      Boys Systolic BP Percentile --      Boys Diastolic BP Percentile --      Pulse Rate 05/09/24 1635 (!) 103     Resp 05/09/24 1635 18     Temp 05/09/24 1635 98 F (36.7 C)     Temp src --      SpO2 05/09/24 1635 100 %     Weight 05/09/24 1634 173 lb (78.5 kg)     Height 05/09/24 1634 5' 7 (1.702 m)     Head Circumference --      Peak Flow --      Pain Score 05/09/24 1634 5     Pain Loc --      Pain Education --      Exclude from Growth Chart --     Most recent vital signs: Vitals:   05/09/24 1635  BP: 100/78  Pulse: (!) 103  Resp: 18  Temp: 98 F (36.7 C)  SpO2: 100%     General: Awake, no distress.  CV:  Good peripheral perfusion.  Mild  tachycardia, regular rhythm, RP 2+ Resp:  Normal effort. CTAB Abd:  No distention. Nontender to deep palpation throughout Neuro:  Aox4, CN II-XII intact, FNF wnl, finger taps fast b/l, 5/5 strength in bilateral finger extension/grip, arm flexion/extension, EHL/FHL. BUE AG 10+ sec no drift, BLE AG 5+ sec no drift. Ambulates with steady gait. SILT. Negative Rhomberg.    ED Results / Procedures / Treatments   Labs (all labs ordered are listed, but only abnormal results are displayed) Labs Reviewed  BASIC METABOLIC PANEL WITH GFR - Abnormal; Notable for the following components:      Result Value   Glucose, Bld 67 (*)    Calcium 8.5 (*)    All other components within normal limits  CBC WITH DIFFERENTIAL/PLATELET  HCG, QUANTITATIVE, PREGNANCY     EKG  N/a   RADIOLOGY N/a    PROCEDURES:  Critical Care performed: No  Procedures   MEDICATIONS ORDERED IN ED: Medications  sodium chloride  0.9 % bolus 1,000 mL (1,000 mLs Intravenous New Bag/Given 05/09/24 2034)  acetaminophen  (TYLENOL ) tablet 1,000 mg (1,000 mg Oral Given 05/09/24 2034)  ketorolac  (TORADOL ) 15 MG/ML injection 15 mg (15 mg Intravenous Given 05/09/24 2024)  metoCLOPramide  (REGLAN ) injection 10 mg (10 mg Intravenous Given 05/09/24 2024)  diphenhydrAMINE  (BENADRYL ) injection 25 mg (25 mg Intravenous Given 05/09/24 2024)     IMPRESSION / MDM / ASSESSMENT AND PLAN / ED COURSE  I reviewed the triage vital signs and the nursing notes.                              DDX/MDM/AP: Differential diagnosis includes, but is not limited to, migraine headache, not clinical concern for intracranial mass hemorrhage or meningitis at this time given reassuring history and exam and this well-appearing patient with history of chronic migraines.  Plan: -Migraine cocktail Basic labs  -IV fluid No indication for repeat imaging at this time - Reassess  Patient's presentation is most consistent with acute complicated illness / injury  requiring diagnostic workup.  ED course below.  Feel much better after migraine cocktail, and will discharge home.  Laboratory workup unremarkable except for borderline hypoglycemia, tolerating good oral intake here.  Plan to continue usual home medication regimen and can use Tylenol  and Motrin in addition.  Plan for neurology follow-up.  No evidence of acute pathology at this time.  ED return precautions in place.  Patient agrees with plan.  Presentation overall most consistent with exacerbation of chronic migraine.  Clinical Course as of 05/09/24 2231  [place] May 09, 2024  2129 Cbc wnl, no leukocytosis  Hcg neg [MM]  2155 BMP with borderline hypoglycemia but otherwise unremarkable, will give juice [MM]  2229 Patient reports near complete resolution of headache, requesting discharge home. [MM]    Clinical Course User Index [MM] Clarine Ozell LABOR, MD     FINAL CLINICAL IMPRESSION(S) / ED DIAGNOSES   Final diagnoses:  Migraine without status migrainosus, not intractable, unspecified migraine type     Rx / DC Orders   ED Discharge Orders     None        Note:  This document was prepared using Dragon voice recognition software and may include unintentional dictation errors.   Clarine Ozell LABOR, MD 05/09/24 2231  "

## 2024-05-10 NOTE — Telephone Encounter (Signed)
 Received voicemail from patient stating she was in the ER for migraine she's had for over a week and they recommended she f/u with us . First available appt not until June-is there anything we can offer patient in the meantime or somewhere sooner we can get her in to discuss?

## 2024-05-10 NOTE — Telephone Encounter (Signed)
 Patient needs an appointment can we use 05/13/24 at 11:30am slot please advise.

## 2024-05-13 ENCOUNTER — Other Ambulatory Visit (HOSPITAL_COMMUNITY): Payer: Self-pay

## 2024-05-13 ENCOUNTER — Encounter: Payer: Self-pay | Admitting: Neurology

## 2024-05-13 ENCOUNTER — Ambulatory Visit: Admitting: Neurology

## 2024-05-13 VITALS — BP 119/78 | HR 89 | Ht 67.0 in | Wt 180.0 lb

## 2024-05-13 DIAGNOSIS — G43E09 Chronic migraine with aura, not intractable, without status migrainosus: Secondary | ICD-10-CM

## 2024-05-13 DIAGNOSIS — G43E11 Chronic migraine with aura, intractable, with status migrainosus: Secondary | ICD-10-CM | POA: Diagnosis not present

## 2024-05-13 DIAGNOSIS — R519 Headache, unspecified: Secondary | ICD-10-CM

## 2024-05-13 MED ORDER — ZEPBOUND 15 MG/0.5ML ~~LOC~~ SOAJ
15.0000 mg | SUBCUTANEOUS | 3 refills | Status: AC
  Filled 2024-05-13: qty 2, 28d supply, fill #0

## 2024-05-13 MED ORDER — NURTEC 75 MG PO TBDP: 75.0000 mg | ORAL_TABLET | ORAL | 1 refills | Status: DC

## 2024-05-13 MED ORDER — UBRELVY 100 MG PO TABS: ORAL_TABLET | ORAL | 5 refills | Status: DC

## 2024-05-13 NOTE — Patient Instructions (Addendum)
 ASSESSMENT AND PLAN :   34 y.o. year old female patient , formerly follow up with Dr Ines here with:    1) Chronic Migraine, failed Emgality  after initial 5 months of migraine  freedom , having failed betablocker, Amitriptyline  and Topamax,  failed abortive Imitrex, Maxalt an now zomig .   2) needs to start NURTEC every day po as preventive. If this fails, will need  Vyepti infusion.   3) abortive : 1 tab Ubrelvy  at onset: and repeat after 2 hours if needed, do not exceed 2 in 24 hours.   4) MRI brain - needs a baseline.    RV in 6 months with NP    I would like to thank Onetha Ahern,MD,  for allowing me to meet with this pleasant patient.

## 2024-05-13 NOTE — Progress Notes (Signed)
 "        Provider:  Dedra Gores, MD  Primary Care Physician:  Patient, No Pcp Per No address on file     Referring Provider: No referring provider defined for this encounter.          Chief Complaint according to patient   Patient presents with:                HISTORY OF PRESENT ILLNESS:  Megan Stone is a 34 y.o. female patient who is here for revisit 05/13/2024 for  ongoing chronic migraines, intractable . Had 16 migraine days in the last 30 days.  Was almost free of migraines until early December.   She went to the ED on Monday :  migraine cocktail, relief after 6 days non-stop headaches,  Migraine   Pt comes with c/o migraine for week now. Pt states she is on meds and it has been working good til last month.   Severe breakthrough with status migrainosus on Emgality  and on Zomig , had failed imitrex and maxalt. .    Needs to start on Vyepti for prevention:   Needs to start on Nurtec for acute migraine , Dr Ines had given samples that had worked.         HPI Megan Stone is a 34 y.o. female PMH migraines presents for evaluation of headache - Patient suffers from chronic migraines.  Has been taking monthly medication for this and Zomig  for breakthrough but has been having about 1 week of typical migraine symptoms that unfortunately are not improving.  Bilateral headache extends down to superlow region and upper neck which is not abnormal for her.  No fever, altered mentation, rash. - No preceding trauma - Not on blood thinners - No focal weakness - Some photophobia.  No nausea.  No vision changes.   Per chart review, seen by neurology in clinic on 03/30/2024 for migraines.  Continued on Emgality  and also Rx Zomig  for breakthrough migraines.   Last CT head in 2021, unremarkable, no structural abnormalities.   She is taking Zomig , Zofran  and Flexaril this morning , was able to return to sleep, but only for another 3 hours , then took Excedrin Migraine.   Has  been on Emgality  since July , was migraine- free for 5 months and now rebounding pain. When I saw her first time as a work in she had been at the end of the honeymoon phase.    DDX/MDM/AP: Differential diagnosis includes, but is not limited to, migraine headache, not clinical concern for intracranial mass hemorrhage or meningitis at this time given reassuring history and exam and this well-appearing patient with history of chronic migraines.   Plan: -Migraine cocktail Basic labs  -IV fluid No indication for repeat imaging at this time - Reassess   Patient's presentation is most consistent with acute complicated illness / injury requiring diagnostic workup.   ED course below.  Feel much better after migraine cocktail, and will discharge home.  Laboratory workup unremarkable except for borderline hypoglycemia, tolerating good oral intake here.  Plan to continue usual home medication regimen and can use Tylenol  and Motrin in addition.  Plan for neurology follow-up.  No evidence of acute pathology at this time.  ED return precautions in place.  Patient agrees with plan.  Presentation overall most consistent with exacerbation of chronic migraine.      Review of Systems: Out of a complete 14 system review, the patient complains of only the following symptoms, and all  other reviewed systems are negative.:   SLEEPINESS ?  How likely are you to doze in the following situations: 0 = not likely, 1 = slight chance, 2 = moderate chance, 3 = high chance  Sitting and Reading? Watching Television? Sitting inactive in a public place (theater or meeting)? Lying down in the afternoon when circumstances permit? Sitting and talking to someone? Sitting quietly after lunch without alcohol? In a car, while stopped for a few minutes in traffic? As a passenger in a car for an hour without a break?  Total =        Social History   Socioeconomic History   Marital status: Married    Spouse name:  andrew   Number of children: 2   Years of education: Not on file   Highest education level: Bachelor's degree (e.g., BA, AB, BS)  Occupational History   Not on file  Tobacco Use   Smoking status: Never    Passive exposure: Never   Smokeless tobacco: Not on file  Vaping Use   Vaping status: Never Used  Substance and Sexual Activity   Alcohol use: Yes    Alcohol/week: 1.0 standard drink of alcohol    Types: 1 Glasses of wine per week    Comment: occasional   Drug use: No   Sexual activity: Yes    Birth control/protection: None  Other Topics Concern   Not on file  Social History Narrative   Not on file   Social Drivers of Health   Tobacco Use: Unknown (05/13/2024)   Patient History    Smoking Tobacco Use: Never    Smokeless Tobacco Use: Unknown    Passive Exposure: Never  Financial Resource Strain: Not on file  Food Insecurity: Not on file  Transportation Needs: Not on file  Physical Activity: Not on file  Stress: Not on file  Social Connections: Not on file  Depression (EYV7-0): Not on file  Alcohol Screen: Not on file  Housing: Not on file  Utilities: Not on file  Health Literacy: Not on file    Family History  Problem Relation Age of Onset   Stroke Father    Dementia Maternal Grandmother    Emphysema Maternal Grandmother    COPD Maternal Grandmother    Stroke Maternal Grandfather    Mental retardation Paternal Grandmother    Migraines Paternal Grandmother    Sleep apnea Paternal Grandmother    Mental retardation Paternal Grandfather     Past Medical History:  Diagnosis Date   Migraines     Past Surgical History:  Procedure Laterality Date   NO PAST SURGERIES       Medications Ordered Prior to Encounter[1]  Allergies[2]   DIAGNOSTIC DATA (LABS, IMAGING, TESTING) - I reviewed patient records, labs, notes, testing and imaging myself where available.  Lab Results  Component Value Date   WBC 5.8 05/09/2024   HGB 13.4 05/09/2024   HCT 39.9  05/09/2024   MCV 85.4 05/09/2024   PLT 333 05/09/2024      Component Value Date/Time   NA 139 05/09/2024 2007   K 3.7 05/09/2024 2007   CL 104 05/09/2024 2007   CO2 25 05/09/2024 2007   GLUCOSE 67 (L) 05/09/2024 2007   BUN 13 05/09/2024 2007   CREATININE 0.77 05/09/2024 2007   CALCIUM 8.5 (L) 05/09/2024 2007   PROT 7.1 02/25/2014 1945   ALBUMIN 4.2 02/25/2014 1945   AST 18 02/25/2014 1945   ALT 15 02/25/2014 1945   ALKPHOS 57 02/25/2014  1945   BILITOT 0.4 02/25/2014 1945   GFRNONAA >60 05/09/2024 2007   GFRAA >90 02/25/2014 1945   No results found for: CHOL, HDL, LDLCALC, LDLDIRECT, TRIG, CHOLHDL No results found for: YHAJ8R No results found for: VITAMINB12 No results found for: TSH  PHYSICAL EXAM:  Vitals:   05/13/24 1151  BP: 119/78  Pulse: 89   No data found. Body mass index is 28.19 kg/m.   Wt Readings from Last 3 Encounters:  05/13/24 180 lb (81.6 kg)  05/09/24 173 lb (78.5 kg)  03/30/24 178 lb (80.7 kg)     Ht Readings from Last 3 Encounters:  05/13/24 5' 7 (1.702 m)  05/09/24 5' 7 (1.702 m)  03/30/24 5' 7 (1.702 m)      General: The patient is awake, alert and appears not in acute distress and groomed. Head: Normocephalic, atraumatic.  Neck is supple. Neck circumference:13. 75  Cardiovascular:  Regular rate and palpable peripheral pulse:  Respiratory: clear to auscultation.  Mallampati 1, Skin:  Without evidence of edema, or rash Trunk: slender , BMI 27. 9     Neurologic exam : The patient is awake and alert, oriented to place and time.   Memory subjective  described as intact.  There is a normal attention span & concentration ability.  Speech is fluent without  dysarthria, dysphonia or aphasia.  Mood and affect are appropriate.   Cranial nerves: Pupils are equal and briskly reactive to light.  Funduscopic exam without evidence of pallor or edema.  Extraocular movements  in vertical and horizontal planes intact and  without nystagmus. Visual fields by finger perimetry are intact. Hearing to finger rub intact.  Facial sensation intact to fine touch. Facial motor strength is symmetric and tongue and uvula move midline.   Motor exam:   Normal tone and normal muscle bulk and symmetric normal strength in all extremities. Grip Strength equal  Proximal strength of shoulder muscles and hip flexors was intact .   Sensory:   normal.  ASSESSMENT AND PLAN :   34 y.o. year old female patient , formerly follow up with Dr Ines here with:    1) Chronic Migraine, failed Emgality  after initial 5 months of migraine  freedom , having failed betablocker, Amitriptyline  and Topamax,  failed abortive Imitrex, Maxalt an now zomig .   2) needs to start NURTEC every day po as preventive. If this fails, will need  Vyepti infusion.   3) abortive : 1 tab Ubrelvy  at onset: and repeat after 2 hours if needed, do not exceed 2 in 24 hours.   4) MRI brain - needs a baseline.     I would like to thank Onetha Ahern,MD,  for allowing me to meet with this pleasant patient.   Sleep Clinic Patients are generally offered input on sleep hygiene, life style changes and how to improve compliance with medical treatment where applicable. Review and reiteration of good sleep hygiene measures is offered to any sleep clinic patient, be it in the first consultation or with any follow up visits.  Any patient with sleepiness should be cautioned not to drive, work at heights, or operate dangerous or heavy equipment when feeling tired or sleepy. The patient will be seen in follow-up in the sleep clinic at [facility] for discussion of test results, sleep related symptoms and treatment compliance review, further management strategies, etc.   The referring provider will be notified of the test results.   The patient's condition requires frequent monitoring and adjustments in  the treatment plan, reflecting the ongoing complexity of care.  This provider is the  continuing focal point for all needed services for this condition.  After spending a total time of  35  minutes face to face and time for  history taking, physical and neurologic examination, review of laboratory studies,  personal review of imaging studies, reports and results of other testing and review of referral information / records as far as provided in visit,   Electronically signed by: Dedra Gores, MD 05/13/2024 12:15 PM  [facility] Board certified by The [employer] of Sleep Medicine and Diplomate of the [employer] of [employer]. Board certified In Neurology through the ABPN, Fellow of the [employer] of Neu[employer].      [1]  Current Outpatient Medications on File Prior to Visit  Medication Sig Dispense Refill   buPROPion (WELLBUTRIN XL) 300 MG 24 hr tablet Take 300 mg by mouth daily.     clonazePAM (KLONOPIN) 0.5 MG tablet Take 0.5 mg by mouth every 8 (eight) hours as needed.     cyclobenzaprine (FLEXERIL) 10 MG tablet Take 10 mg by mouth daily as needed.     desvenlafaxine (PRISTIQ) 100 MG 24 hr tablet Take 100 mg by mouth.     desvenlafaxine (PRISTIQ) 25 MG 24 hr tablet Take 25 mg by mouth.     Galcanezumab -gnlm (EMGALITY ) 120 MG/ML SOAJ Inject into skin every 30 days. 1.12 mL 11   MAGNESIUM PO Take 500 mg by mouth daily.     Multiple Vitamin (MULTIVITAMIN) tablet Take 1 tablet by mouth daily.     ondansetron  (ZOFRAN  ODT) 4 MG disintegrating tablet Take 1 tablet (4 mg total) by mouth every 8 (eight) hours as needed for nausea or vomiting. 20 tablet 0   PRISTIQ 100 MG 24 hr tablet Take 150 mg by mouth daily.     propranolol  (INDERAL ) 20 MG tablet Take 1 tablet (20 mg total) by mouth 2 (two) times daily. 60 tablet 6   Rimegepant Sulfate (NURTEC) 75 MG TBDP Take 1 tablet (75 mg total) by mouth daily as needed. For migraines. Take as close to onset of migraine as possible. One daily maximum. 30 tablet 1   SEROQUEL 200  MG tablet Take 200 mg by mouth at bedtime.     tirzepatide  (ZEPBOUND ) 15 MG/0.5ML Pen Inject 15 mg into the skin once a week. (Patient taking differently: Inject 20 mLs into the skin once a week.) 2 mL 3   zolmitriptan  (ZOMIG ) 5 MG tablet Take 1 tablet (5 mg total) by mouth as needed for migraine. Take at onset and can repeat 2 hours later 12 tablet 11   No current facility-administered medications on file prior to visit.  [2] No Known Allergies  "

## 2024-05-17 ENCOUNTER — Encounter: Payer: Self-pay | Admitting: Neurology

## 2024-05-17 ENCOUNTER — Telehealth: Payer: Self-pay | Admitting: Neurology

## 2024-05-17 MED ORDER — NURTEC 75 MG PO TBDP: 75.0000 mg | ORAL_TABLET | ORAL | 1 refills | Status: DC

## 2024-05-17 NOTE — Addendum Note (Signed)
 Addended by: Seanne Chirico-JACKSON, Jeronimo Hellberg L on: 05/17/2024 09:27 AM   Modules accepted: Orders

## 2024-05-17 NOTE — Telephone Encounter (Signed)
 [employer] No auth required Case Number: [id number] sent to [facility] [phone]

## 2024-05-19 MED ORDER — UBRELVY 100 MG PO TABS: ORAL_TABLET | ORAL | 0 refills | Status: AC

## 2024-05-19 MED ORDER — NURTEC 75 MG PO TBDP: 75.0000 mg | ORAL_TABLET | ORAL | Status: AC

## 2024-05-21 ENCOUNTER — Telehealth: Payer: Self-pay | Admitting: Pharmacy Technician

## 2024-05-21 ENCOUNTER — Other Ambulatory Visit (HOSPITAL_COMMUNITY): Payer: Self-pay

## 2024-05-21 NOTE — Telephone Encounter (Signed)
 PA has been submitted, and telephone encounter has been created. Please see telephone encounter dated 1.23.26.

## 2024-05-21 NOTE — Telephone Encounter (Signed)
 Pharmacy Patient Advocate Encounter   Received notification from Patient Advice Request messages that prior authorization for UBRELVY  100MG  is required/requested.   Insurance verification completed.   The patient is insured through [facility].   Per test claim: PA required; PA submitted to above mentioned insurance via Latent Key/confirmation #/EOC [facility] Status is pending

## 2024-05-21 NOTE — Telephone Encounter (Signed)
 Pharmacy Patient Advocate Encounter   Received notification from Patient Advice Request messages that prior authorization for NURTEC 75MG  is required/requested.   Insurance verification completed.   The patient is insured through [employer].   Per test claim: PA required; PA submitted to above mentioned insurance via Latent Key/confirmation #/EOC [id number] Status is pending

## 2024-05-21 NOTE — Telephone Encounter (Signed)
 Pharmacy Patient Advocate Encounter  Received notification from [facility] that Prior Authorization for UBRELVY  100MG  has been APPROVED from 1.23.26 to 1.23.27. Ran test claim, Copay is $0. This test claim was processed through [facility]- copay amounts may vary at other pharmacies due to pharmacy/plan contracts, or as the patient moves through the different stages of their insurance plan.   PA #/Case ID/Reference #: [id number]

## 2024-05-21 NOTE — Telephone Encounter (Signed)
 Pharmacy Patient Advocate Encounter  Received notification from [facility] that Prior Authorization for NURTEC 75MG  has been APPROVED from 1.23.26 to 1.23.27. Ran test claim, Copay is $0. This test claim was processed through [facility]- copay amounts may vary at other pharmacies due to pharmacy/plan contracts, or as the patient moves through the different stages of their insurance plan.   PA #/Case ID/Reference #: [id number]

## 2024-11-10 ENCOUNTER — Ambulatory Visit: Admitting: Neurology

## 2025-04-04 ENCOUNTER — Ambulatory Visit: Admitting: Adult Health
